# Patient Record
Sex: Female | Born: 1935
Health system: Southern US, Community
[De-identification: ages and names within clinical notes are randomized; demographics above are authoritative.]

## PROBLEM LIST (undated history)

## (undated) DIAGNOSIS — I739 Peripheral vascular disease, unspecified: Secondary | ICD-10-CM

## (undated) DIAGNOSIS — E785 Hyperlipidemia, unspecified: Secondary | ICD-10-CM

## (undated) DIAGNOSIS — K219 Gastro-esophageal reflux disease without esophagitis: Secondary | ICD-10-CM

## (undated) DIAGNOSIS — J449 Chronic obstructive pulmonary disease, unspecified: Secondary | ICD-10-CM

## (undated) DIAGNOSIS — I219 Acute myocardial infarction, unspecified: Secondary | ICD-10-CM

## (undated) DIAGNOSIS — I779 Disorder of arteries and arterioles, unspecified: Secondary | ICD-10-CM

## (undated) DIAGNOSIS — I2510 Atherosclerotic heart disease of native coronary artery without angina pectoris: Secondary | ICD-10-CM

## (undated) HISTORY — DX: Chronic obstructive pulmonary disease, unspecified: J44.9

## (undated) HISTORY — DX: Acute myocardial infarction, unspecified: I21.9

## (undated) HISTORY — DX: Peripheral vascular disease, unspecified: I73.9

## (undated) HISTORY — DX: Disorder of arteries and arterioles, unspecified: I77.9

## (undated) HISTORY — DX: Atherosclerotic heart disease of native coronary artery without angina pectoris: I25.10

## (undated) HISTORY — DX: Hyperlipidemia, unspecified: E78.5

## (undated) HISTORY — DX: Gastro-esophageal reflux disease without esophagitis: K21.9

---

## 2007-03-29 HISTORY — PX: CORONARY ARTERY BYPASS GRAFT: SHX141

## 2007-08-31 ENCOUNTER — Ambulatory Visit: Payer: Self-pay | Admitting: Cardiology

## 2007-09-01 ENCOUNTER — Encounter: Payer: Self-pay | Admitting: Cardiology

## 2007-09-01 ENCOUNTER — Encounter: Payer: Self-pay | Admitting: Physician Assistant

## 2007-09-02 ENCOUNTER — Ambulatory Visit: Payer: Self-pay | Admitting: Internal Medicine

## 2007-09-02 ENCOUNTER — Inpatient Hospital Stay (HOSPITAL_COMMUNITY): Admission: EM | Admit: 2007-09-02 | Discharge: 2007-09-13 | Payer: Self-pay | Admitting: Internal Medicine

## 2007-09-03 ENCOUNTER — Encounter: Payer: Self-pay | Admitting: Internal Medicine

## 2007-09-04 ENCOUNTER — Encounter: Payer: Self-pay | Admitting: Thoracic Surgery (Cardiothoracic Vascular Surgery)

## 2007-09-04 ENCOUNTER — Ambulatory Visit: Payer: Self-pay | Admitting: Thoracic Surgery (Cardiothoracic Vascular Surgery)

## 2007-09-13 ENCOUNTER — Inpatient Hospital Stay: Admission: AD | Admit: 2007-09-13 | Discharge: 2007-09-14 | Payer: Self-pay | Admitting: Internal Medicine

## 2007-09-27 ENCOUNTER — Ambulatory Visit: Payer: Self-pay | Admitting: Thoracic Surgery (Cardiothoracic Vascular Surgery)

## 2007-10-02 ENCOUNTER — Ambulatory Visit: Payer: Self-pay | Admitting: Cardiology

## 2007-10-02 ENCOUNTER — Encounter: Payer: Self-pay | Admitting: Physician Assistant

## 2007-10-05 ENCOUNTER — Ambulatory Visit: Payer: Self-pay | Admitting: Thoracic Surgery (Cardiothoracic Vascular Surgery)

## 2007-10-05 ENCOUNTER — Encounter
Admission: RE | Admit: 2007-10-05 | Discharge: 2007-10-05 | Payer: Self-pay | Admitting: Thoracic Surgery (Cardiothoracic Vascular Surgery)

## 2007-11-20 ENCOUNTER — Ambulatory Visit: Payer: Self-pay | Admitting: Cardiology

## 2008-02-11 ENCOUNTER — Encounter: Payer: Self-pay | Admitting: Cardiology

## 2008-04-10 ENCOUNTER — Ambulatory Visit: Payer: Self-pay | Admitting: Cardiology

## 2008-04-10 ENCOUNTER — Encounter: Payer: Self-pay | Admitting: Physician Assistant

## 2008-06-16 ENCOUNTER — Encounter: Payer: Self-pay | Admitting: Cardiology

## 2008-07-25 ENCOUNTER — Ambulatory Visit: Payer: Self-pay | Admitting: Cardiology

## 2008-07-25 ENCOUNTER — Encounter: Payer: Self-pay | Admitting: Physician Assistant

## 2008-08-05 ENCOUNTER — Encounter: Payer: Self-pay | Admitting: Physician Assistant

## 2008-10-15 ENCOUNTER — Encounter: Payer: Self-pay | Admitting: Cardiology

## 2008-10-16 DIAGNOSIS — E782 Mixed hyperlipidemia: Secondary | ICD-10-CM

## 2008-10-16 DIAGNOSIS — R0989 Other specified symptoms and signs involving the circulatory and respiratory systems: Secondary | ICD-10-CM | POA: Insufficient documentation

## 2008-12-22 ENCOUNTER — Encounter: Payer: Self-pay | Admitting: Physician Assistant

## 2009-01-12 ENCOUNTER — Encounter: Payer: Self-pay | Admitting: Cardiology

## 2009-01-18 ENCOUNTER — Encounter: Payer: Self-pay | Admitting: Cardiology

## 2009-01-26 ENCOUNTER — Encounter: Payer: Self-pay | Admitting: Cardiology

## 2009-02-05 ENCOUNTER — Ambulatory Visit: Payer: Self-pay | Admitting: Cardiology

## 2009-02-05 DIAGNOSIS — I6529 Occlusion and stenosis of unspecified carotid artery: Secondary | ICD-10-CM

## 2009-03-22 IMAGING — CR DG CHEST 2V
2 series · 2 of 2 positions shown · non-contrast
Comparison: Chest x-ray of 09/10/2007

CLINICAL DATA: Dizziness, status post CABG

CHEST - 2 VIEW

[w chest pa]
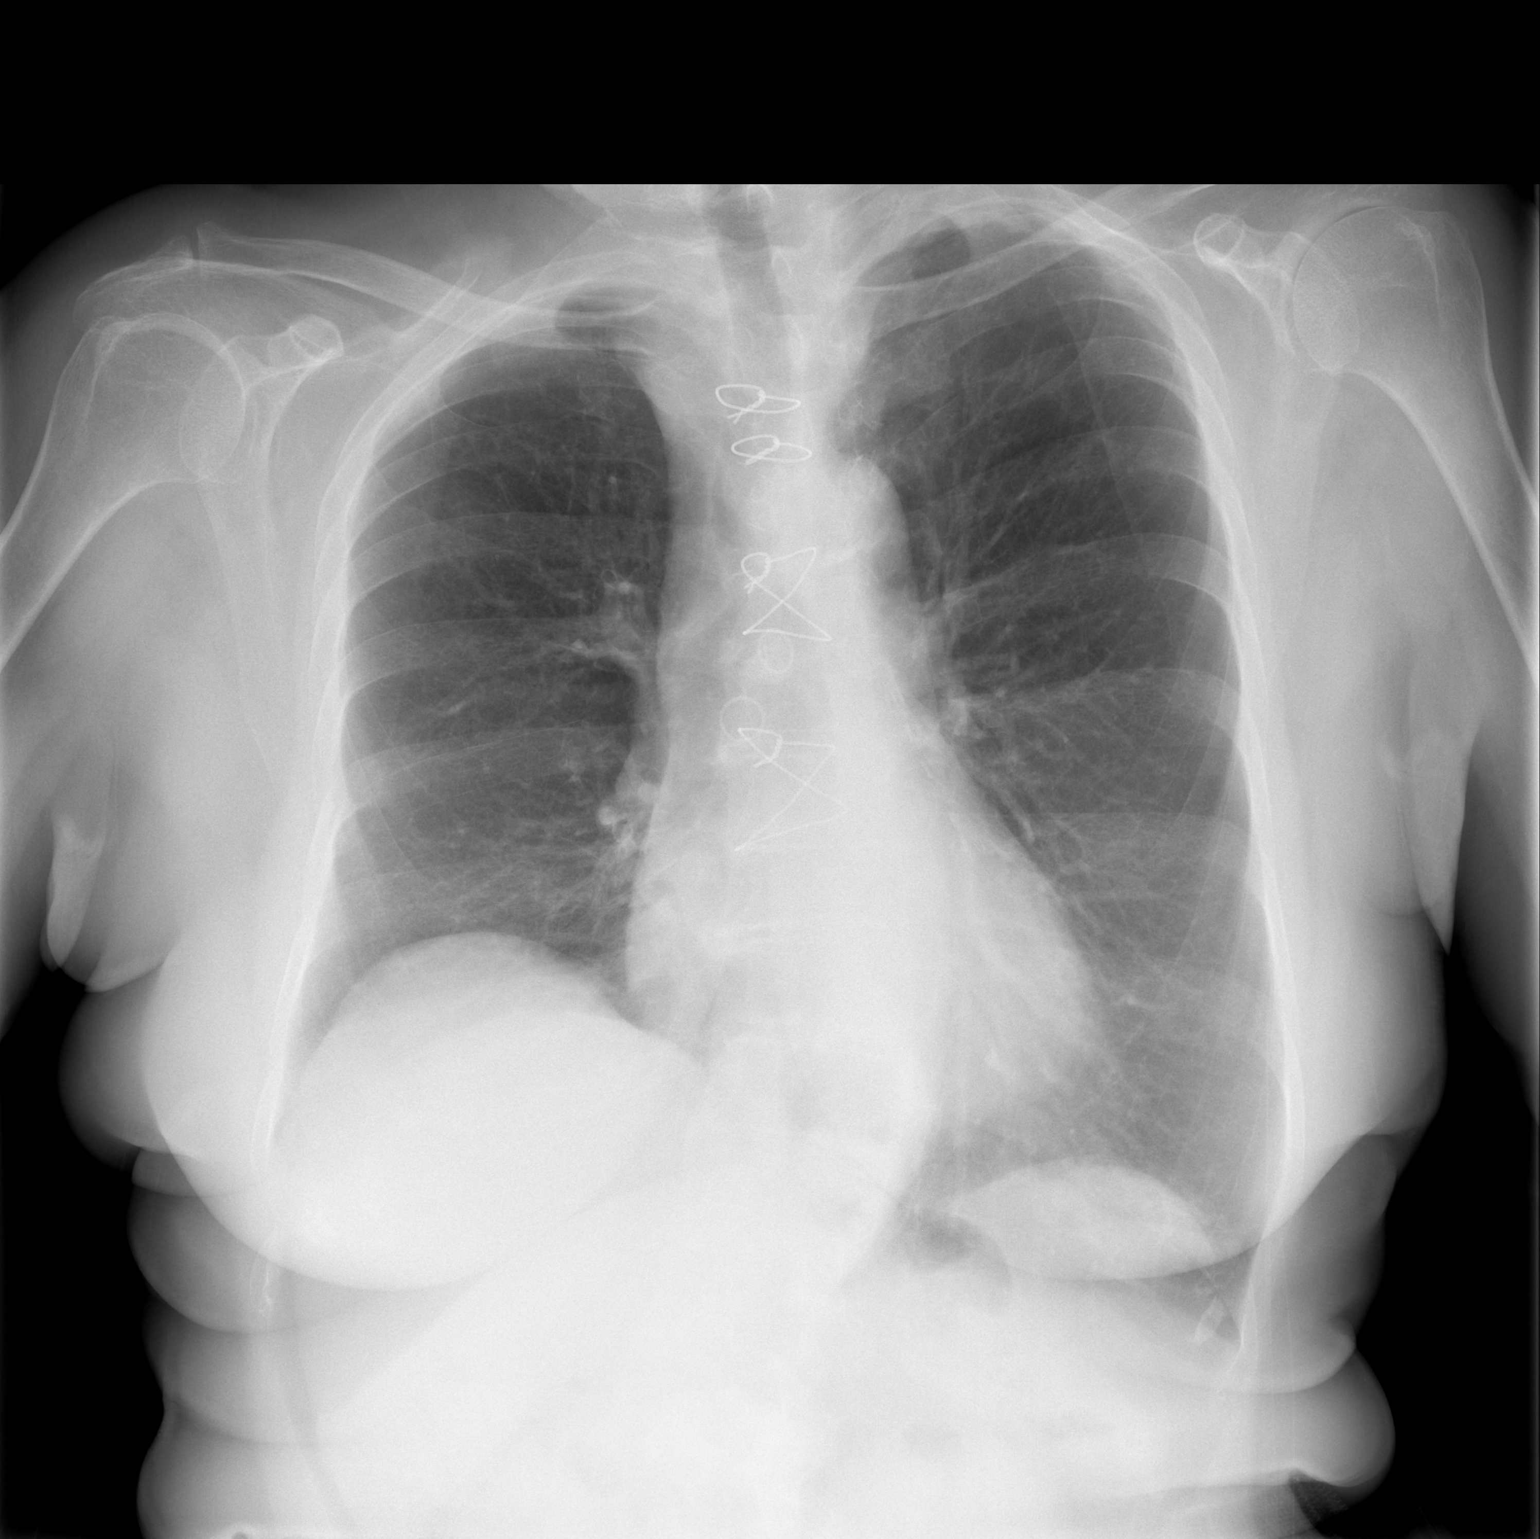

[w chest lat]
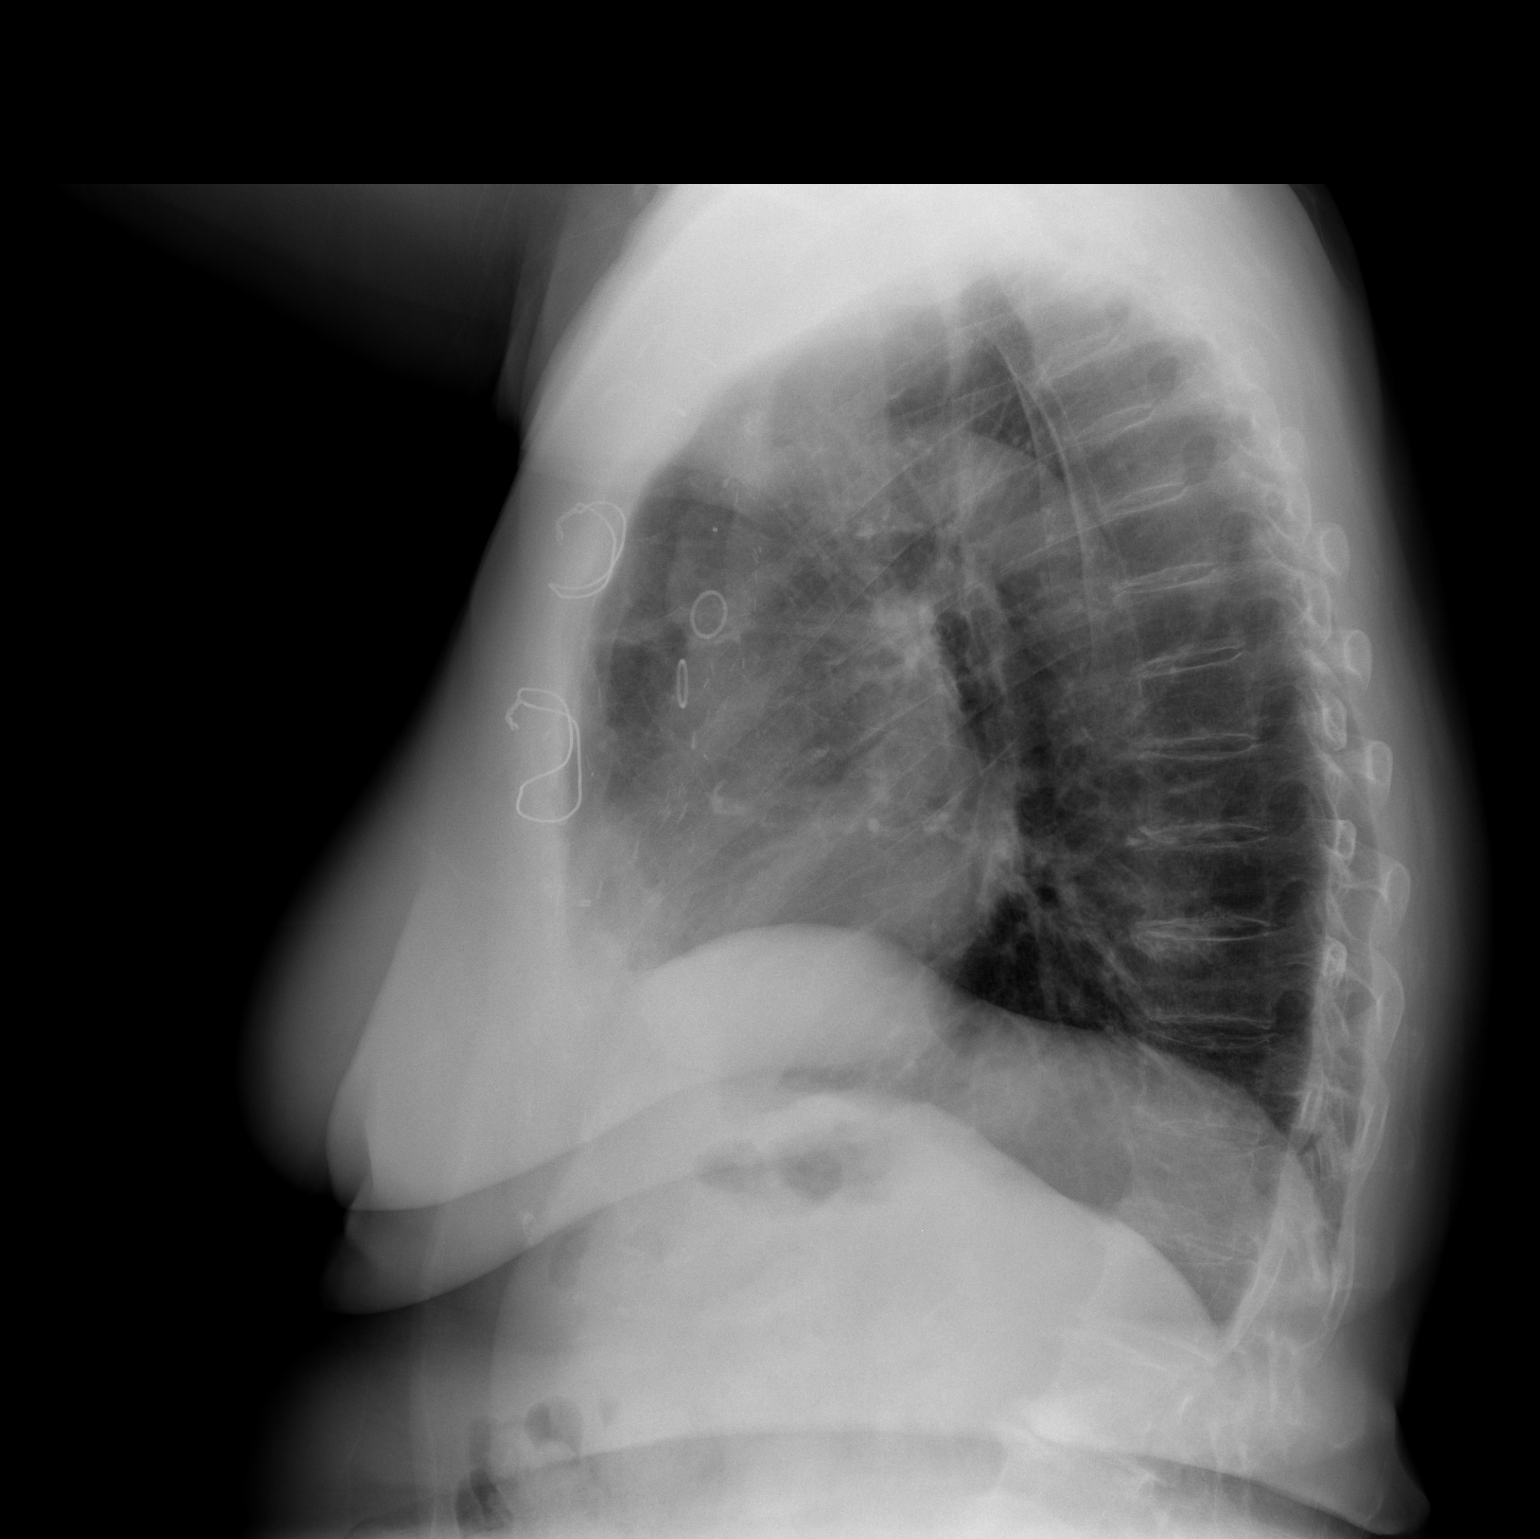

[2 of 2 positions shown; findings below may reference images not displayed]

FINDINGS: The effusions have resolved and aeration has improved.
The heart is within upper limits of normal.  There may be a small
hiatal hernia present.  Median sternotomy sutures are noted.  The
bones are osteopenic clear
IMPRESSION: No active lung disease.  Resolution of effusions and basilar
atelectasis.

## 2009-07-06 ENCOUNTER — Telehealth (INDEPENDENT_AMBULATORY_CARE_PROVIDER_SITE_OTHER): Payer: Self-pay | Admitting: *Deleted

## 2009-08-03 ENCOUNTER — Encounter (INDEPENDENT_AMBULATORY_CARE_PROVIDER_SITE_OTHER): Payer: Self-pay | Admitting: *Deleted

## 2009-08-04 ENCOUNTER — Encounter: Payer: Self-pay | Admitting: Cardiology

## 2009-08-11 ENCOUNTER — Ambulatory Visit: Payer: Self-pay | Admitting: Cardiology

## 2009-08-11 DIAGNOSIS — I2510 Atherosclerotic heart disease of native coronary artery without angina pectoris: Secondary | ICD-10-CM | POA: Insufficient documentation

## 2009-08-11 DIAGNOSIS — J449 Chronic obstructive pulmonary disease, unspecified: Secondary | ICD-10-CM

## 2009-08-11 DIAGNOSIS — J4489 Other specified chronic obstructive pulmonary disease: Secondary | ICD-10-CM | POA: Insufficient documentation

## 2009-08-26 ENCOUNTER — Encounter: Payer: Self-pay | Admitting: Cardiology

## 2009-09-08 ENCOUNTER — Telehealth (INDEPENDENT_AMBULATORY_CARE_PROVIDER_SITE_OTHER): Payer: Self-pay | Admitting: *Deleted

## 2009-10-12 ENCOUNTER — Encounter (INDEPENDENT_AMBULATORY_CARE_PROVIDER_SITE_OTHER): Payer: Self-pay | Admitting: *Deleted

## 2009-10-21 ENCOUNTER — Encounter (INDEPENDENT_AMBULATORY_CARE_PROVIDER_SITE_OTHER): Payer: Self-pay | Admitting: *Deleted

## 2009-10-26 ENCOUNTER — Encounter: Payer: Self-pay | Admitting: Cardiology

## 2010-01-04 ENCOUNTER — Encounter (INDEPENDENT_AMBULATORY_CARE_PROVIDER_SITE_OTHER): Payer: Self-pay | Admitting: *Deleted

## 2010-02-01 ENCOUNTER — Encounter: Payer: Self-pay | Admitting: Cardiology

## 2010-02-08 ENCOUNTER — Ambulatory Visit: Payer: Self-pay | Admitting: Cardiology

## 2010-03-11 ENCOUNTER — Telehealth: Payer: Self-pay | Admitting: Cardiology

## 2010-04-16 ENCOUNTER — Encounter (INDEPENDENT_AMBULATORY_CARE_PROVIDER_SITE_OTHER): Payer: Self-pay | Admitting: *Deleted

## 2010-04-27 NOTE — Miscellaneous (Signed)
Summary: Orders Update  Clinical Lists Changes  Orders: Added new Test order of T-Lipid Profile ([id number]) - Signed Added new Test order of T-Hepatic Function ([id number]) - Signed 

## 2010-04-27 NOTE — Letter (Signed)
Summary: Generic Engineer, agricultural at [facility] [address]   [place], [place] [address]   Phone: [phone]  Fax: [phone]        October 21, 2009 MRN: [id number]    [facility] [address] [place], [place]  [address]    Dear Ms. Cendejas,    You are due to have lab work done. An order was faxed to the [facility] for this lab work on October 12, 2009. However, it does not appear this has been done yet.  Please, take the enclosed order to the [facility] at your earliest convenience to have this done. If you will not be able to do your lab work at this time, please notify our office so that we can properly document this in  your chart.       Sincerely,  Cyril Loosen, RN, BSN  This letter has been electronically signed by your physician.

## 2010-04-27 NOTE — Progress Notes (Signed)
Summary: Needs Refill on NTG  Phone Note Call from Patient Call back at Home Phone [phone]   Summary of Call: Pt left message on voicemail stating she has had the same NTG bottle for the past 2 yrs. She was told this should be changed every 6 months. If this is true, [facility] She stated in the message that it is okay to leave the answer on her voicemail. She also stated that she has an appt on 5/17.   Pt notified and verbalized understanding. She also mentioned that she usually does labs before her appts. Per November 2010 OV note, pt will have FLP/LFT done at time of next office visit. Notified pt she can have this done before her appt. She is aware to be NPO.  Initial call taken by: Cyril Loosen, RN, BSN,  July 06, 2009 12:51 PM    New/Updated Medications: NITROSTAT 0.4 MG SUBL (NITROGLYCERIN) Place on tablet under tongue every 5 minutes up to 3 doses as needed for chest pain. No more than 3 doses over a 15 minute period. Prescriptions: NITROSTAT 0.4 MG SUBL (NITROGLYCERIN) Place on tablet under tongue every 5 minutes up to 3 doses as needed for chest pain. No more than 3 doses over a 15 minute period.  #25 x 3   Entered by:   Cyril Loosen, RN, BSN   Authorized by:   Loreli Slot, MD, [facility]   Signed by:   Cyril Loosen, RN, BSN on 07/06/2009   Method used:   [facility]  [address] [id number]       [address]       [place], [place]  [address]       Ph: [phone] or [phone]       Fax: [phone]   RxID:   [id number]

## 2010-04-27 NOTE — Miscellaneous (Signed)
Summary: Orders Update  Clinical Lists Changes  Orders: Added new Test order of T-Hepatic Function ([id number]) - Signed Added new Test order of T-Lipid Profile ([id number]) - Signed 

## 2010-04-27 NOTE — Assessment & Plan Note (Signed)
Summary: 6 mo fu per NOVE REMINDER   Visit Type:  Follow-up Primary Ewing Fandino:  Dr. Donzetta Sprung   History of Present Illness: 77 year old woman presents for followup. I saw her back in May. She reports no angina or limiting shortness of breath. She continues to dance regularly.  Followup labs from 7 November revealed an AST of 51, ALT 44, cholesterol 208, triglycerides 113, HDL 60, LDL 125. I reviewed these with her today. She states that she has not been tolerating even low-dose simvastatin 10 mg a day related to leg discomfort. She is actually not taking it at this point. She is on some other cholesterol-lowering supplements.  No claudication symptoms. Blood pressure control has been good.  Current Medications (verified): 1)  Metoprolol Tartrate 25 Mg Tabs (Metoprolol Tartrate) .... Take 1 Tablet By Mouth Two Times A Day 2)  Aspirin 81 Mg Tbec (Aspirin) .... Take 2 Tablet By Mouth Once A Day 3)  Fish Oil Triple Strength 1360 Mg Caps (Omega-3 Fatty Acids) .... Take 2 Tablet By Mouth Two Times A Day 4)  Zyrtec Allergy 10 Mg Tabs (Cetirizine Hcl) .... Take 1 Tablet By Mouth Once A Day As Needed 5)  Proventil Hfa 108 (90 Base) Mcg/act Aers (Albuterol Sulfate) .... As Needed 6)  Nitrostat 0.4 Mg Subl (Nitroglycerin) .... Place On Tablet Under Tongue Every 5 Minutes Up To 3 Doses As Needed For Chest Pain. No More Than 3 Doses Over A 15 Minute Period. 7)  Symbicort 80-4.5 Mcg/act Aero (Budesonide-Formoterol Fumarate) .... Twice Daily 8)  Complete  Tabs (Multiple Vitamins-Minerals) .... Take 1 Tablet By Mouth Once A Day 9)  Odorless Garlic 500 Mg Tabs (Garlic) .... Take 2 Tablet By Mouth Once A Day 10)  Simvastatin 10 Mg Tabs (Simvastatin) .... Take One Tablet By Mouth Daily At Bedtime 11)  Lisinopril 5 Mg Tabs (Lisinopril) .... Take 1 Tablet By Mouth Once A Day 12)  Cholest Wise .... Take 2 Tablet By Mouth Two Times A Day 13)  Super Calcium/d 600-125 Mg-Unit Tabs (Calcium-Vitamin D) .... Take 2  Tablet By Mouth Once A Day 14)  Stress B Complex/zinc  Tabs (Multiple Vitamins-Minerals) .... Take 1 Tablet By Mouth Once A Day  Allergies (verified): 1)  ! Pcn  Comments:  Nurse/Medical Assistant: The patient's medication list and allergies were reviewed with the patient and were updated in the Medication and Allergy Lists.  Past History:  Past Medical History: Last updated: 02/05/2009 COPD CAD Hyperlipidemia G E R D Myocardial Infarction - NSTEMI June 2009 Cerebrovascular Disease - less than 50% RICA, 50-69% LICA, May 2010  Past Surgical History: Last updated: 02/05/2009 CABG - off-pump LIMA to LAD, SVG to first obtuse marginal, SVG to PDA, June 2009  Social History: Last updated: 10/16/2008 Widowed  Tobacco Use - Former.  - 1999 Alcohol Use - no Regular Exercise - yes dancing 15 hrs a week Drug Use - no Retired   Review of Systems  The patient denies anorexia, fever, weight loss, chest pain, syncope, dyspnea on exertion, peripheral edema, melena, and hematochezia.         Otherwise reviewed and negative.  Vital Signs:  Patient profile:   77 year old female Height:      64 inches Weight:      166 pounds BMI:     28.60 O2 Sat:      94 % on Room air Pulse rate:   58 / minute BP sitting:   116 / 67  (left arm)  Cuff size:   regular  Vitals Entered By: Carlye Grippe (February 08, 2010 8:56 AM)  Nutrition Counseling: Patient's BMI is greater than 25 and therefore counseled on weight management options.  O2 Flow:  Room air  Physical Exam  Additional Exam:  Overweight woman in no acute distress. HEENT: Conjunctiva and lids normal, oropharynx with moist mucosa. Neck: Supple, no elevated JVP, soft left bruit.. Lungs: Diminished breath sounds, slight end expiratory rhonchi. No active wheezing. Cardiac: Regular rate and rhythm, no S3. Thorax: Chest wall stable status post sternotomy. Abdomen: Soft, nontender, bowel sounds present. Skin: Warm and  dry. Musculoskeletal: No gross deformities. Extremities: No pitting edema, distal pulses one plus. Neuropsychiatric: Alert and oriented x3, affect appropriate.   Carotid Doppler  Procedure date:  08/26/2009  Findings:      50-69% stenosis of the LICA and no evidence of hemodynamically significant stenosis of the RICA. Bilateral proximal ECA stenoses. Moderate calcified plaque in the carotid bulb and proximal ICAs.  EKG  Procedure date:  02/08/2010  Findings:      Sinus rhythm at 61 beats per minutes with septal Q waves, nonspecific ST changes.  Impression & Recommendations:  Problem # 1:  CORONARY ATHEROSCLEROSIS NATIVE CORONARY ARTERY (ICD-414.01)  Symptomatically stable on medical therapy. No changes made today. Encouraged regular exercise. I will see her back on an annual basis. Continue to follow up with primary care in the meanwhile.  Her updated medication list for this problem includes:    Metoprolol Tartrate 25 Mg Tabs (Metoprolol tartrate) .Marland Kitchen... Take 1 tablet by mouth two times a day    Aspirin 81 Mg Tbec (Aspirin) .Marland Kitchen... Take 2 tablet by mouth once a day    Nitrostat 0.4 Mg Subl (Nitroglycerin) .Marland Kitchen... Place on tablet under tongue every 5 minutes up to 3 doses as needed for chest pain. no more than 3 doses over a 15 minute period.    Lisinopril 5 Mg Tabs (Lisinopril) .Marland Kitchen... Take 1 tablet by mouth once a day  Problem # 2:  CAROTID ARTERY DISEASE (ICD-433.10)  Moderate stenosis on the left, followup Doppler in one year.  Her updated medication list for this problem includes:    Aspirin 81 Mg Tbec (Aspirin) .Marland Kitchen... Take 2 tablet by mouth once a day  Orders: EKG w/ Interpretation (93000)  Her updated medication list for this problem includes:    Aspirin 81 Mg Tbec (Aspirin) .Marland Kitchen... Take 2 tablet by mouth once a day  Problem # 3:  HYPERLIPIDEMIA-MIXED (ICD-272.4)  Plan to discontinue simvastatin altogether. She will continue strategy of diet and her other supplements. Plan  to followup fasting lipid profile and liver function tests in 3 months. We can also discuss a different statin medication down the road if she is willing.  The following medications were removed from the medication list:    Simvastatin 10 Mg Tabs (Simvastatin) .Marland Kitchen... Take one tablet by mouth daily at bedtime  Patient Instructions: 1)  Your physician wants you to follow-up in: 1 year. You will receive a reminder letter in the mail one-two months in advance. If you don't receive a letter, please call our office to schedule the follow-up appointment. 2)  Your physician has requested that you have a carotid duplex. This test is an ultrasound of the carotid arteries in your neck. It looks at blood flow through these arteries that supply the brain with blood. Allow one hour for this exam. There are no restrictions or special instructions. DO IN 1 YEAR BEFORE YOUR OFFICE VISIT.  3)  Your physician recommends that you go to the [facility] for a FASTING lipid profile and liver function labs:  DO IN 3 MONTHS. 4)  Stop Simvastatin.

## 2010-04-27 NOTE — Assessment & Plan Note (Signed)
Summary: 6 mo fu per may reminder   Visit Type:  Follow-up Primary Provider:  Dr. Selinda Flavin   History of Present Illness: 77 year old woman presents for followup. She states that she continues to dance regularly, 5 days a week. She has been experiencing more shortness of breath, and seems to indicate an increase in wheezing associated with her COPD. She also reports a dry cough. She has concerns about both her metoprolol and lisinopril. We discussed these issues today.  Followup labs from 10 May reveal AST 33,  ALT 33, total cholesterol 210, triglycerides 156, HDL 56, LDL 123. She states she was not able to tolerate low-dose Crestor reporting "muscle aches." She has not tried simvastatin.  She does not indicate any anginal chest pain. She otherwise reports compliance with her medications which are reviewed below. She has not required any nitroglycerin.  Current Medications (verified): 1)  Lisinopril 5 Mg Tabs (Lisinopril) .... Take 1 Tablet By Mouth Twice A Day 2)  Metoprolol Tartrate 25 Mg Tabs (Metoprolol Tartrate) .... Take 1 Tablet By Mouth Twice A Day 3)  Aspirin 81 Mg Tbec (Aspirin) .... Take 1 Tablet By Mouth Once A Day 4)  Fish Oil Double Strength 1200 Mg Caps (Omega-3 Fatty Acids) .... Take 1 Tablet By Mouth Two Times A Day 5)  Zyrtec Allergy 10 Mg Tabs (Cetirizine Hcl) .... Take 1 Tablet By Mouth Once A Day 6)  Proventil Hfa 108 (90 Base) Mcg/act Aers (Albuterol Sulfate) .... As Needed 7)  Nitrostat 0.4 Mg Subl (Nitroglycerin) .... Place On Tablet Under Tongue Every 5 Minutes Up To 3 Doses As Needed For Chest Pain. No More Than 3 Doses Over A 15 Minute Period. 8)  Symbicort 80-4.5 Mcg/act Aero (Budesonide-Formoterol Fumarate) .... Twice Daily 9)  Complete  Tabs (Multiple Vitamins-Minerals) .... Take 1 Tablet By Mouth Once A Day 10)  Betaine Hcl 300 Mg Tabs (Betaine Hcl) .... Use As Directed 11)  Odorless Garlic 500 Mg Tabs (Garlic) .... Take 2 Tablet By Mouth Once A Day 12)   Chelated Potassium 99 Mg Tabs (Potassium) .... Take 1 Tablet By Mouth Once A Day 13)  Time Release Circuleg .... Take 1 Tablet By Mouth Once A Day  Allergies (verified): 1)  ! Pcn  Comments:  Nurse/Medical Assistant: The patient's medications and allergies were reviewed with the patient and were updated in the Medication and Allergy Lists. List reviewed.  Past History:  Past Medical History: Last updated: 02/05/2009 COPD CAD Hyperlipidemia G E R D Myocardial Infarction - NSTEMI June 2009 Cerebrovascular Disease - less than 50% RICA, 50-69% LICA, May 2010  Past Surgical History: Last updated: 02/05/2009 CABG - off-pump LIMA to LAD, SVG to first obtuse marginal, SVG to PDA, June 2009  Social History: Last updated: 10/16/2008 Widowed  Tobacco Use - Former.  - 1999 Alcohol Use - no Regular Exercise - yes dancing 15 hrs a week Drug Use - no Retired   Review of Systems       The patient complains of dyspnea on exertion.  The patient denies anorexia, fever, weight loss, chest pain, peripheral edema, prolonged cough, headaches, hemoptysis, melena, hematochezia, and severe indigestion/heartburn.         Otherwise reviewed and negative except as outlined above.  Vital Signs:  Patient profile:   77 year old female Height:      64 inches Weight:      165 pounds O2 Sat:      98 % on Room air Pulse rate:  60 / minute BP sitting:   104 / 62  (left arm) Cuff size:   regular  Vitals Entered By: Carlye Grippe (Aug 11, 2009 8:23 AM)  O2 Flow:  Room air  Physical Exam  Additional Exam:  Overweight woman in no acute distress. HEENT: Conjunctiva and lids normal, oropharynx with moist mucosa. Neck: Supple, no elevated JVP, soft left bruit.. Lungs: Diminished breath sounds, slight end expiratory rhonchi. No active wheezing. Cardiac: Regular rate and rhythm, no S3. Thorax: Chest wall stable status post sternotomy. Abdomen: Soft, nontender, bowel sounds present. Skin: Warm and  dry. Musculoskeletal: No gross deformities. Extremities: No pitting edema, distal pulses one plus. Neuropsychiatric: Alert and oriented x3, affect appropriate.   EKG  Procedure date:  08/11/2009  Findings:      Sinus rhythm at 64 beats per minutes with PAC.  Impression & Recommendations:  Problem # 1:  CORONARY ATHEROSCLEROSIS NATIVE CORONARY ARTERY (ICD-414.01)  Patient relatively stable, with electrocardiogram being nonspecific today. She continues to exercise regularly in the form of dancing, 5 days a week. It may be that some of her increased shortness of breath is more pulmonary in etiology based on her description of mild wheezing and also a dry cough. We discussed modifying her medical regimen, including discontinuation of lisinopril in favor of Cozaar, and also reducing her metoprolol it in half. If these changes are not effective, I asked her to let us know, otherwise we will see her back over the next 6 months.  The following medications were removed from the medication list:    Lisinopril 5 Mg Tabs (Lisinopril) .Marland Kitchen... Take 1 tablet by mouth twice a day Her updated medication list for this problem includes:    Metoprolol Tartrate 25 Mg Tabs (Metoprolol tartrate) .Marland Kitchen... Take 1/2 tablet by mouth two times a day    Aspirin 81 Mg Tbec (Aspirin) .Marland Kitchen... Take 1 tablet by mouth once a day    Nitrostat 0.4 Mg Subl (Nitroglycerin) .Marland Kitchen... Place on tablet under tongue every 5 minutes up to 3 doses as needed for chest pain. no more than 3 doses over a 15 minute period.  Problem # 2:  COPD (ICD-496)  As noted above, will reduce beta blocker dose. She will otherwise continue her current pulmonary regimen under the direction of Dr. Dimas Aguas.  Her updated medication list for this problem includes:    Proventil Hfa 108 (90 Base) Mcg/act Aers (Albuterol sulfate) .Marland Kitchen... As needed    Symbicort 80-4.5 Mcg/act Aero (Budesonide-formoterol fumarate) .Marland Kitchen... Twice daily  Problem # 3:  HYPERLIPIDEMIA-MIXED  (ICD-272.4)  LDL not optimally controlled. She did not tolerate low-dose Crestor as noted. Will try simvastatin 10 mg p.o. q.h.s., and if tolerated, followup with lipid profile and liver function tests in 12 weeks.  The following medications were removed from the medication list:    Crestor 10 Mg Tabs (Rosuvastatin calcium) .Marland Kitchen... Take 1/2  tablet by mouth at bedtime    Niacin Cr 500 Mg Cr-caps (Niacin) .Marland Kitchen... Take 3 tablet by mouth once a day Her updated medication list for this problem includes:    Simvastatin 10 Mg Tabs (Simvastatin) .Marland Kitchen... Take one tablet by mouth daily at bedtime  Problem # 4:  CAROTID ARTERY DISEASE (ICD-433.10)  Nonobstructive based on Dopplers from May 2010. Followup study will be arranged.  Her updated medication list for this problem includes:    Aspirin 81 Mg Tbec (Aspirin) .Marland Kitchen... Take 1 tablet by mouth once a day  Orders: Carotid Duplex (Carotid Duplex)  Other Orders:  EKG w/ Interpretation (93000)  Patient Instructions: 1)  Your physician wants you to follow-up in: 6 months. You will receive a reminder letter in the mail one-two months in advance. If you don't receive a letter, please call our office to schedule the follow-up appointment. 2)  Your physician recommends that you go to the [facility] for a FASTING lipid profile and liver function labs:  IN 12 WEEKS. You will get a letter when it is time for this lab work. 3)  Start Simvastatin 10mg  by mouth at bedtime. 4)  Decrease Lopressor (metoprolol) to 1/2 tablet by mouth two times a day. 5)  Start Cozaar (losartan) 50mg  by mouth once daily. 6)  Stop Lisnipril. Prescriptions: LOSARTAN POTASSIUM 50 MG TABS (LOSARTAN POTASSIUM) Take 1 tablet by mouth once a day  #30 x 6   Entered by:   Cyril Loosen, RN, BSN   Authorized by:   Loreli Slot, MD, [facility]   Signed by:   Cyril Loosen, RN, BSN on 08/11/2009   Method used:   [facility]  [address] [address] [facility]       [address]       [place], [place]  [address]       Ph: [phone] or [phone]       Fax: [phone]   RxID:   [id number]   Handout requested. SIMVASTATIN 10 MG TABS (SIMVASTATIN) Take one tablet by mouth daily at bedtime  #30 x 6   Entered by:   Cyril Loosen, RN, BSN   Authorized by:   Loreli Slot, MD, [facility]   Signed by:   Cyril Loosen, RN, BSN on 08/11/2009   Method used:   [facility]  [address] [facility]       [address]       [place], [place]  [address]       Ph: [phone] or [phone]       Fax: [phone]   RxID:   [id number]   Handout requested.   Appended Document: 6 mo fu per may reminder Pt states she had Carotid Doppler done on 01/18/09 when she was in the ER at [facility] for SOB. Notified pt there is no record of Carotid doppler being done at that time. There is no report in [facility]. It is not mentioned in the ER note. It is not listed on the ER orders. Pt looked at bill from ER and read each charge. She was not charged for Carotid doppler. Notified pt it does not appear she has had doppler done in 1 year and Dr. Diona Browner would like this repeated at this time. Pt agreeable to have carotid doppelr done. She is aware Lynden Ang will schedule this test and notify her of appt time.

## 2010-04-27 NOTE — Progress Notes (Signed)
Summary: pt is back on old meds  Phone Note Call from Patient Call back at Home Phone [phone]   Caller: PT LEFT MESSAGE Reason for Call: Talk to Nurse Summary of Call: S: Pt was given new meds for BP and it was making her BP higher and causing headaces,  so she started back on the old meds she would rather have trouble breathing then no feel good. Initial call taken by: Faythe Ghee,  September 08, 2009 8:42 AM  Follow-up for Phone Call        Pt seen on 5/17. Lisinopril d/c'd, started Cozaar 50mg  once daily, decreased metoprolol to 25mg  1/2 two times a day. Pt states her BP running higher now than before. She states Sunday her BP was 200/100+ while reading. She states this am BP was 191/99 before taking meds. She went back to previous dosing of Lisinopril and metoprolol and stopped Cozaar. She states BP is 117/73 now. She states she has been having back pain and wonders if this is contributing to increased BP but feels previous med regimen better controls BP. Pt is aware note will be sent to Dr. Diona Browner for his review and she will be notified if he has any futher recommendations. Follow-up by: Cyril Loosen, RN, BSN,  September 08, 2009 11:08 AM  Additional Follow-up for Phone Call Additional follow up Details #1::        OK.  She should keep an eye on BP and breathing status. Additional Follow-up by: Loreli Slot, MD, [facility],  September 08, 2009 8:29 PM    Additional Follow-up for Phone Call Additional follow up Details #2::    Left message to notify pt.  Follow-up by: Cyril Loosen, RN, BSN,  September 09, 2009 10:15 AM

## 2010-04-27 NOTE — Letter (Signed)
Summary: Generic Engineer, agricultural at [facility] [address]   [place], [place] [address]   Phone: [phone]  Fax: [phone]        Aug 03, 2009 MRN: [id number]    [facility] [address] [place], [place]  [address]    Dear Ms. Fleer,   This is a reminder to have lab work done to check your cholesterol and liver function before your office visit with Dr. Diona Browner on May 17th. Please, take the enclosed order to the [facility] to have this lab work drawn. Do not eat or drink after midnight.   We will discuss the results of your labs at your office visit on May 17th at 8:20 am.        Sincerely,  Cyril Loosen, RN, BSN  This letter has been electronically signed by your physician.

## 2010-04-29 NOTE — Miscellaneous (Signed)
Summary: Orders Update  Clinical Lists Changes  Orders: Added new Test order of T-Hepatic Function ([id number]) - Signed Added new Test order of T-Lipid Profile ([id number]) - Signed 

## 2010-04-29 NOTE — Progress Notes (Signed)
Summary: Increased BP  Phone Note Call from Patient   Summary of Call: Pt called concerned about BP. She states last night her BP shot up. It was 193/96. She had a headache in the front portion of her head. She states she knew the NTG would take her BP down so she took NTG x3 over 15 mins and got BP down to an acceptable level. She states her BP this am is 138/66.   She does states that she has been having some cramps in her legs so she started taking OTC Potassium for this. She states normally her BP runs around 117/69.   She will monitor BP and notify our office if it seems to be trending higher than usual. Pt verbalized understanding.  Initial call taken by: Cyril Loosen, RN, BSN,  March 11, 2010 9:27 AM  Follow-up for Phone Call        Noted. Follow-up by: Loreli Slot, MD, [facility],  March 11, 2010 10:08 AM

## 2010-05-04 ENCOUNTER — Encounter: Payer: Self-pay | Admitting: Cardiology

## 2010-05-27 ENCOUNTER — Encounter: Payer: Self-pay | Admitting: Cardiology

## 2010-05-28 ENCOUNTER — Encounter: Payer: Self-pay | Admitting: Cardiology

## 2010-05-28 DIAGNOSIS — R0602 Shortness of breath: Secondary | ICD-10-CM | POA: Insufficient documentation

## 2010-05-31 ENCOUNTER — Encounter: Payer: Self-pay | Admitting: Physician Assistant

## 2010-06-02 ENCOUNTER — Encounter: Payer: Self-pay | Admitting: Physician Assistant

## 2010-06-02 DIAGNOSIS — R0602 Shortness of breath: Secondary | ICD-10-CM

## 2010-06-08 ENCOUNTER — Encounter: Payer: Self-pay | Admitting: *Deleted

## 2010-06-08 NOTE — Letter (Signed)
Summary: External Correspondence/  [employer]  External Correspondence/  [employer]   Imported By: Dorise Hiss 06/01/2010 16:39:39  _____________________________________________________________________  External Attachment:    Type:   Image     Comment:   External Document

## 2010-06-10 ENCOUNTER — Encounter: Payer: Self-pay | Admitting: *Deleted

## 2010-06-15 NOTE — Letter (Signed)
Summary: Engineer, materials at [facility]  [address]   [place], [place] [address]   Phone: [phone]  Fax: [phone]        June 10, 2010 MRN: [id number]    [facility] [address] [place], [place]  [address]    Dear Ms. Halabi,  Your test ordered by Selena Batten has been reviewed by your physician (or physician assistant) and was found to be normal or stable. Your physician (or physician assistant) felt no changes were needed at this time.  __X__ Echocardiogram  ____ Cardiac Stress Test  ____ Lab Work  ____ Peripheral vascular study of arms, legs or neck  ____ CT scan or X-ray  ____ Lung or Breathing test  ____ Other:   Thank you.   Cyril Loosen, RN, BSN    Duane Boston, M.D., F.A.C.C. Thressa Sheller, M.D., F.A.C.C. Oneal Grout, M.D., F.A.C.C. Cheree Ditto, M.D., F.A.C.C. Daiva Nakayama, M.D., F.A.C.C. Kenney Houseman, M.D., F.A.C.C. Jeanne Ivan, PA-C

## 2010-07-06 ENCOUNTER — Encounter: Payer: Self-pay | Admitting: Cardiology

## 2010-07-06 ENCOUNTER — Ambulatory Visit (INDEPENDENT_AMBULATORY_CARE_PROVIDER_SITE_OTHER): Payer: Medicare Other | Admitting: Cardiology

## 2010-07-06 VITALS — BP 126/58 | HR 59 | Ht 65.0 in | Wt 165.0 lb

## 2010-07-06 DIAGNOSIS — I2510 Atherosclerotic heart disease of native coronary artery without angina pectoris: Secondary | ICD-10-CM

## 2010-07-06 DIAGNOSIS — E782 Mixed hyperlipidemia: Secondary | ICD-10-CM

## 2010-07-06 DIAGNOSIS — I6529 Occlusion and stenosis of unspecified carotid artery: Secondary | ICD-10-CM

## 2010-07-06 DIAGNOSIS — R0602 Shortness of breath: Secondary | ICD-10-CM

## 2010-07-06 NOTE — Progress Notes (Signed)
Clinical Summary Hannah Estes is a 77 y.o.female presenting for followup. She was seen in November 2011.  Records indicate followup with [facility] back in early March with shortness of breath. PVCs were noted on ECG. Imdur was initiated. Lab work from 5 March showed BUN 15, creatinine 0.7, sodium 134, potassium 4.4, TSH 6.3.  Echocardiogram from 7 March showed mild LVH with LVEF 65%, diastolic dysfunction, mild to moderate left atrial enlargement, mild mitral regurgitation, thickened aortic valve with no stenosis, mild tricuspid regurgitation, RVSP 32 mm of mercury, no pericardial effusion.  She has had difficulty tolerating even low-dose statin therapy, and simvastatin was discontinued at her last visit. Followup lab work from 6 February showed AST 43, ALT 44, cholesterol 205, and was rides 146, HDL 55, LDL 121.  She states that since her visit with [facility] in early March, shortness of breath has improved significantly. She noted this initially when she was dancing, much less problematic at this time. No exertional chest pain. She does not indicate that Imdur was helpful with symptom control. Complains of a headache with that medication.  Today we discussed continued observation on medical therapy versus proceeding with a followup ischemic evaluation. She prefers observation at this point. No complaints of palpitations or syncope.   Allergies  Allergen Reactions  . Penicillins     Current outpatient prescriptions:albuterol (PROVENTIL HFA) 108 (90 BASE) MCG/ACT inhaler, Inhale 2 puffs into the lungs every 6 (six) hours as needed.  , Disp: , Rfl: ;  aspirin 81 MG tablet, Take 81 mg by mouth daily.  , Disp: , Rfl: ;  budesonide-formoterol (SYMBICORT) 80-4.5 MCG/ACT inhaler, Inhale 2 puffs into the lungs 2 (two) times daily.  , Disp: , Rfl:  calcium carbonate (OS-CAL) 600 MG TABS, Take 600 mg by mouth 2 (two) times daily with a meal.  , Disp: , Rfl: ;  cetirizine (ZYRTEC) 10 MG tablet, Take 10 mg  by mouth daily.  , Disp: , Rfl: ;  Garlic 500 MG CAPS, Take 1 capsule by mouth 2 (two) times daily.  , Disp: , Rfl: ;  levothyroxine (SYNTHROID, LEVOTHROID) 25 MCG tablet, Take 25 mcg by mouth daily.  , Disp: , Rfl:  lisinopril (PRINIVIL,ZESTRIL) 5 MG tablet, Take 5 mg by mouth daily.  , Disp: , Rfl: ;  metoprolol tartrate (LOPRESSOR) 25 MG tablet, Take 25 mg by mouth 2 (two) times daily.  , Disp: , Rfl: ;  Multiple Vitamins-Minerals (MULTIVITAMIN WITH MINERALS) tablet, Take 1 tablet by mouth daily.  , Disp: , Rfl: ;  Multiple Vitamins-Minerals (STRESS B COMPLEX/ZINC PO), Take 1 tablet by mouth daily.  , Disp: , Rfl:  nitroGLYCERIN (NITROSTAT) 0.4 MG SL tablet, Place 0.4 mg under the tongue every 5 (five) minutes as needed.  , Disp: , Rfl: ;  Omega-3 Fatty Acids (FISH OIL TRIPLE STRENGTH) 1360 MG CAPS, Take 1 capsule by mouth daily.  , Disp: , Rfl: ;  DISCONTD: isosorbide mononitrate (IMDUR) 30 MG 24 hr tablet, Take 30 mg by mouth daily.  , Disp: , Rfl:   Past Medical History  Diagnosis Date  . COPD (chronic obstructive pulmonary disease)   . Coronary artery disease     Multivessel, LVEF 65%  . Hyperlipidemia   . GERD (gastroesophageal reflux disease)   . Myocardial infarction     NSTEMI 6/09  . Carotid artery disease     Social History Hannah Estes reports that she has quit smoking. Her smoking use included Cigarettes. She has never used smokeless  tobacco. Hannah Estes reports that she does not drink alcohol.  Review of Systems Some trouble with breathing during the pollen season, additionally outlined above, otherwise reviewed and negative.  Physical Examination Filed Vitals:   07/06/10 0844  BP: 126/58  Pulse: 59  Overweight woman in no acute distress. HEENT: Conjunctiva and lids normal, oropharynx with moist mucosa. Neck: Supple, no elevated JVP, soft left bruit.. Lungs: Diminished breath sounds, slight end expiratory rhonchi. No active wheezing. Cardiac: Regular rate and rhythm,  no S3. Thorax: Chest wall stable status post sternotomy. Abdomen: Soft, nontender, bowel sounds present. Skin: Warm and dry. Musculoskeletal: No gross deformities. Extremities: No pitting edema, distal pulses one plus. Neuropsychiatric: Alert and oriented x3, affect appropriate.   ECG Sinus rhythm at 65 beats per minute with single PAC, nonspecific T-wave changes, diminished R wave progression anteriorly.  Studies Last carotid Doppler on 08/26/2009 showed 50-69% stenosis of the LICA and no evidence of hemodynamically significant stenosis of the RICA. Bilateral proximal ECA stenoses. Moderate calcified plaque in the carotid bulb and proximal ICAs.  Problem List and Plan

## 2010-07-06 NOTE — Assessment & Plan Note (Signed)
No active angina, ECG overall nonspecific with poor R wave progression. Known CAD status post CABG in 2009. We did discuss considering followup stress testing, however at this point she prefers observation since symptoms have improved. Continue office followup.

## 2010-07-06 NOTE — Assessment & Plan Note (Signed)
Improved overall. Could have been multifactorial with history of chronic lung disease, also during the pollen season. Followup cardiac testing is noted above, overall reassuring. If symptoms progress, we can always consider followup ischemic evaluation.

## 2010-07-06 NOTE — Assessment & Plan Note (Signed)
Due for followup carotid Doppler, this will be arranged.

## 2010-07-06 NOTE — Patient Instructions (Signed)
   Stop Imdur  Carotid Doppler just before next office visit.  Your physician wants you to follow up in:  3 months.  You will receive a reminder letter in the mail one-two months in advance.  If you don't receive a letter, please call our office to schedule the follow up appointment.

## 2010-07-06 NOTE — Assessment & Plan Note (Signed)
She indicates that she does not want to consider other statin therapies at this point.

## 2010-07-07 ENCOUNTER — Encounter: Payer: Self-pay | Admitting: Physician Assistant

## 2010-07-09 ENCOUNTER — Encounter: Payer: Self-pay | Admitting: Cardiology

## 2010-08-10 NOTE — Assessment & Plan Note (Signed)
[facility]                          Hannah Estes OFFICE NOTE   Hannah Estes, Hannah Estes                        MRN:          [id number]  DATE:10/02/2007                            DOB:          [birth year]    CARDIOLOGIST:  She will be new to Dr. Nona Dell.   PRIMARY CARE PHYSICIAN:  Dr. Selinda Flavin.   REASON FOR VISIT:  Post-hospitalization followup.   HISTORY OF PRESENT ILLNESS:  Hannah Estes is a 77 year old female  patient who was previously healthy until she presented to [facility] with chest pain.  She ruled in for non-ST-elevation myocardial  infarction and was transferred to [facility] for further  evaluation.  Cardiac catheterization was performed that revealed 3-  vessel disease and she was referred for bypass surgery.  This was done  by Dr. Dorris Fetch on September 07, 2007.  Her grafts included a LIMA to the  LAD, vein graft to the first obtuse marginal, and vein graft to the  posterior descending.  Her LV function was well preserved.  Her EF was  noted be at 55% by echocardiogram.  She had periapical hypokinesis.  Postoperatively, she did develop atrial fibrillation.  She was treated  with amiodarone.  She was supposed to be on this medication at  discharge.  Coumadin was not initiated.  She has seen Dr. Tyrone Sage back  in followup on September 27, 2007.  She apparently has some cellulitis  involving one of her chest tube sites.  She has been treated with  antibiotics.  She returns to our office for post-hospitalization  followup.   The patient denies any significant chest discomfort.  She notes chest  soreness, which is improving.  She denies any significant shortness of  breath.  She denies orthopnea, PND, or pedal edema.  She denies any  syncope, near syncope, or palpitations.   CURRENT MEDICATIONS:  1. Crestor - she recently stopped this secondary to myalgias.  2. Metoprolol 25 mg half a tablet b.i.d.  3. Singular 10  mg daily.  4. Aspirin 81 mg daily.  5. Bactrim b.i.d.  6. Nitroglycerin p.r.n. chest pain.  7. Tylenol p.r.n.  8. Protonix p.r.n.   PHYSICAL EXAMINATION:  GENERAL:  She is a well-nourished and well-  developed female, in no distress.  VITAL SIGNS:  Blood pressure 120/73, pulse 89, and weight 153 pounds.  HEENT:  Normal.  NECK:  Without JVD.  CARDIAC:  S1 and S2.  Regular rate and rhythm.  LUNGS:  Clear to auscultation bilaterally.  ABDOMEN:  Soft and nontender.  EXTREMITIES:  Without edema.  NEUROLOGIC:  She is alert and oriented x3.  Cranial nerves II through  XII are grossly intact.  VASCULAR:  Right femoral arteriotomy site without hematoma or bruit.   Electrocardiogram reveals sinus rhythm with a heart rate of 86, normal  axis, T-wave inversion in 1, aVL, V1 through 4.   IMPRESSION:  1. Coronary artery disease.      a.     Status post non-ST-elevation myocardial infarction in June       2009.  b.     Status post coronary artery bypass grafting on September 07, 2007, by Dr. Dorris Fetch as outlined above.  2. Preserved left ventricular function with an ejection fraction of      55%.  3. Treated dyslipidemia.      a.     Lipid panel on September 03, 2007:  Total cholesterol 210,       triglycerides 132, HDL 69, and LDL 115.  4. Gastroesophageal reflux disease.  5. Chronic obstructive pulmonary disease.  6. Postoperative anemia.  7. Postoperative atrial fibrillation.      a.     Maintaining normal sinus rhythm.      b.     Previously on amiodarone therapy.   PLAN:  1. Hannah Estes presents for post-hospitalization followup.  She is      doing remarkably well.  She sees Dr. Dorris Fetch for routine      followup later this week.  She is already walking several minutes a      day without difficulty.  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  2. She had to stop her Crestor secondary to myalgias.  I have       recommended pravastatin 20 mg a day.  If she can tolerate this, we      will check lipids and LFTs in 12 weeks.  3. We will have her follow up with a BMET and CBC today.  4. She was supposed to be on amiodarone.  However, this medication was      never prescribed.  She is maintaining normal sinus rhythm.  There      is really no indication to place her back on amiodarone at this      time.  I did want her to go on metoprolol 25 mg twice a day,      however, she noted significant hypotension on this dose previously.      We will continue her medications the same at this point.  5. The patient will be brought back in followup in the next 6 weeks      with Dr. Diona Browner or sooner p.r.n.      Tereso Newcomer, PA-C  Electronically Signed      Jonelle Sidle, MD  Electronically Signed   SW/MedQ  DD: 10/02/2007  DT: 10/03/2007  Job #: [id number]   cc:   Selinda Flavin

## 2010-08-10 NOTE — Assessment & Plan Note (Signed)
[facility]                          [facility] OFFICE NOTE   Hannah Estes, Hannah Estes                        MRN:          [id number]  DATE:07/25/2008                            DOB:          24-Jun-1925    PRIMARY CARDIOLOGIST:  Jonelle Sidle, MD   REASON FOR VISIT:  Six-month followup.   Hannah Estes is a delightful 77 year old female, status post three-  vessel CABG in June 2009, with normal left ventricular function, who  returns for scheduled followup.   Since her last visit here in August of last year, she continues to  remain extremely active, currently dancing as often as 3 times a week.  She denies any exertional angina pectoris.  She also denies any symptoms  suggestive of significant exertional dose dyspnea or easy fatiguability,  which were her presenting symptoms back in 2009.  Of note, she did have  postop paroxysmal atrial fibrillation, which was briefly treated with  amiodarone.  She denies any tachy palpitations.   Hannah Estes does have history of intolerance to numerous STATINS.  At  time of last visit, she was started on Pravachol, by Dr. Diona Browner, but  she then stopped this secondary to significant myalgia.  I suggested  increasing her niacin to 1000 mg daily, which she is currently  tolerating.  She is also on fish oil.  Her most recent profile in March  of this year notable for a total cholesterol of 225, triglyceride 188,  HDL 46, and LDL 141.  Of note, the LDL was identical to the previous  level in November of last year.   CURRENT MEDICATIONS:  1. Aspirin 81 daily.  2. Metoprolol 25 b.i.d.  3. Lisinopril 5 daily.  4. Fish oil 1000 t.i.d.  5. Niacin 1000 nightly.  6. Singulair 10 p.r.n.   PHYSICAL EXAMINATION:  VITAL SIGNS:  Blood pressure 118/62, pulse 63,  regular, weight 150.8 (up 3).  GENERAL:  An 77 year old female sitting upright, in no distress.  HEENT:  Normocephalic and atraumatic.  NECK:  Palpable  bilateral carotid pulses with soft, bilateral bruits  (right greater than left).  LUNGS:  Clear to auscultation in all fields.  HEART:  Regular rate and rhythm.  No significant murmurs.  No rubs.  ABDOMEN:  Soft and nontender.  EXTREMITIES:  No edema.  NEUROLOGIC:  No focal deficit.   IMPRESSION:  1. Multivessel coronary artery disease.      a.     Three-vessel CABG, June 2009.      b.     Postop atrial fibrillation.  2. Normal LVF.  3. Dyslipidemia.      a.     Intolerant to multiple statins.  4. Chronic obstructive pulmonary disease.      a.     Remote tobacco.  5. Bilateral carotid bruits.      a.     History of nonobstructive disease.   PLAN:  1. The patient has agreed to try Crestor again, but at the very low      dose of 5 mg daily.  I recommended this given her  persistently      elevated LDL level, despite being on fish oil and niacin.  In the      meanwhile, however, we will further increase fish oil to 2 tablets      twice daily, representing a total of 4 g.  We will continue the      current dose of niacin for the time being.  She will need followup      fasting lipid/liver profile in 12 weeks.  2. Schedule carotid Dopplers for reevaluation of noted bilateral      bruits.  Her preop studies in June 2009 did not show any      significant disease.  3. Schedule return clinic followup with myself and Dr. Diona Browner in 6      months.      Rozell Searing, PA-C  Electronically Signed      Jonelle Sidle, MD  Electronically Signed   GS/MedQ  DD: 07/25/2008  DT: 07/26/2008  Job #: [id number]   cc:   Selinda Flavin

## 2010-08-10 NOTE — Assessment & Plan Note (Signed)
OFFICE VISIT   Hannah Estes, Hannah Estes  DOB:  19-Jan-1926                                        September 27, 2007  CHART #:  [id number]   HISTORY:  The patient has undergone coronary artery bypass grafting on  09/07/2007, by Dr. Dorris Fetch.  The patient was discharged home to [facility] in [place].  However, once she got there she was almost  immediately discharged home.  She has been doing relatively well at  home, but returns today with the chest tube site in the midline being  red, swollen, and some drainage and another small area of redness to the  left.  She called, saying there was a wire sticking out of this area.   PHYSICAL EXAMINATION:  The patient comes into the office today.  VITAL SIGNS:  Her temperature is 97.5, blood pressure 156/86,  respiratory rate 18, pulse is 86, and O2 sats 94%.  GENERAL:  Overall, the patient looks well.  CHEST:  Her sternum is stable and well healed in the midline.  The old  chest tube site is with drainage and slight redness.  The pacing wires  were completely removed, and there was no wire sticking out of her.  There was one superficial skin pustule on the upper left abdomen not  associated with the pacing wire sites.  With this small site and the  chest tube erythema, it was felt that antibiotics would be appropriate.  Cultures were obtained on the chest tube site, and the patient was  started on Keflex 250 mg p.o. every 8 hours with the culture pending.  We will check sensitivities and change antibiotics as appropriately.   She has an appointment to see Dr. Dorris Fetch in approximately 1 week,  already made and we will keep that appointment.  She is instructed to  return to the office if there is no improvement, and to call our office  if there is no improvement over several days.   Sheliah Plane, MD  Electronically Signed   EG/MEDQ  D:  09/27/2007  T:  09/28/2007  Job:  [id number]   cc:   Salvatore Decent. Dorris Fetch,  M.D.

## 2010-08-10 NOTE — Op Note (Signed)
NAMESADEY, YANDELL               ACCOUNT NO.:  [id number]   MEDICAL RECORD NO.:  [id number]          PATIENT TYPE:  INP   LOCATION:  2311                         FACILITY:  [facility]   PHYSICIAN:  Salvatore Decent. Dorris Fetch, M.D.DATE OF BIRTH:  06-09-1925   DATE OF PROCEDURE:  09/07/2007  DATE OF DISCHARGE:                               OPERATIVE REPORT   PREOPERATIVE DIAGNOSIS:  Severe three-vessel coronary artery disease  with unstable angina.   POSTOPERATIVE DIAGNOSIS:  Severe three-vessel coronary artery disease  with unstable angina.   PROCEDURES:  Median sternotomy, off-pump coronary artery bypass grafting  x3 (left internal mammary artery to left anterior descending, saphenous  vein graft to first obtuse marginal, saphenous vein graft to posterior  descending), endoscopic vein harvest, right leg.   SURGEON:  Salvatore Decent. Dorris Fetch, MD   FIRST ASSISTANTSalvatore Decent. Cornelius Moras, MD   SECOND ASSISTANT:  Stephanie Acre. Dominick, PA   ANESTHESIA:  General.   FINDINGS:  Good-quality target vessels, mammary good-quality vein, fair-  quality, but acceptable for the patient's age, OM2 too small to graft.   CLINICAL NOTE:  Ms. Pinkstaff is a 77 year old woman who presents with  unstable anginal symptoms.  She ruled in for a non-Q-wave myocardial  infarction.  At catheterization, she had severe three-vessel coronary  artery disease with 95% stenosis in her LAD, total occlusion of her  right coronary, 70% stenosis in her circumflex, her ejection fraction  was 55%.  She was advised to undergo coronary artery bypass grafting.  The indications, risks, benefits, and alternatives were discussed in  detail with the patient.  She understood and accepted the risks and  agreed to proceed.   OPERATIVE NOTE:  Ms. Mcqueary was brought to the preop holding area on  September 07, 2007.  The Anesthesia established central venous access and  placed a Swan-Ganz catheter as well as an arterial blood pressure  monitoring line.  Intravenous antibiotics were administered.  She was  taken to the operating room, anesthetized, and intubated.  A Foley  catheter was placed.  The chest, abdomen, and legs were prepped and  draped in usual fashion.  Incision was made in the medial aspect of the  right leg at the level of the knee.  The greater saphenous vein was  harvested from upper calf to the groin.  Endoscopically, the saphenous  vein was of fair quality, relatively small, but appropriate for the  patient's age.  There were no significant varicosities.   Simultaneously, a median sternotomy was performed and the left internal  mammary artery was harvested using standard technique.  Mammary was  relatively small as well but was a good-quality vessel.  Heparin 2000  units was administered during the vessel harvest.  Remainder of the full  heparin dose was given prior to opening the pericardium.   The pericardium was opened.  The ascending aorta was inspected.  There  was calcific atherosclerotic disease at the site normally used for  aortic cannulation.  There was no significant atherosclerotic disease  more proximally where the vein grafts were to be placed.  The patient  was placed in Trendelenburg position and rotated to the right side.  The  coronary arteries were inspected to select anastomotic sites, and the  conduits were inspected and cut to length.  The Guidant Xpose system was  used to stabilize the heart during the performance of the off-pump  grafting.  Of note, a Bair Hugger was placed over the patient's lower  body and room temperature was kept near 80 degrees Fahrenheit.  The  apical suction was placed on the heart and the apex was retracted  anteriorly and superiorly exposing the posterior descending.  The  stabilizing bars were placed adjacent to the anastomotic site on the  posterior descending coronary arteries.  Silastic tapes were used  proximally and distally on all the  anastomoses, shunts were not  necessary.  An arteriotomy was made in the posterior descending.  This  was a 1.5-mm vessel's thick wall, but no significant stenosis or  atherosclerotic disease at the site of the anastomoses.  The vein graft  was anastomosed end-to-side with a running 7-0 Prolene suture.  At the  completion of the vein graft anastomoses, they were flushed with warm  heparinized saline ensuring that no air bubbles were entrapped to assess  flow.  Flow was satisfactory as was hemostasis.   Next, the apex of the heart was retracted to the right side and a  stabilizer was placed adjacent to the anastomotic site on the first  obtuse marginal branch of the left circumflex.  This vessel had some  moderate atherosclerotic disease at the site distal to the anastomosis.  The vein graft was anastomosed end-to-side with a running 7-0 Prolene  suture.  Again, there was satisfactory flow through the graft and good  hemostasis.   Next, the proximal anastomoses were performed for the vein grafts.  These were done using a 3.8-mm Hemashield 3 device.  A punch aortotomy  was performed in the aorta.  The Hemashield was placed within the aorta.  The anastomosis was performed and the Hemashield device was removed.  Next, the apex was retracted more inferiorly exposing the left anterior  descending.  The stabilizer was placed at the site of the anastomosis.  Arteriotomy was made and the left internal mammary artery was  anastomosed to the mid LAD with a running 8-0 Prolene suture.  Both were  1.5-mm vessels.  The LAD was good quality vessel at the site of the  anastomosis.  At completion of the mammary LAD anastomosis, bulldog  clamps were removed, de-aired prior to tying the suture.  The Silastic  tapes were removed as well.  The mammary pedicle was tacked to the  epicardial surface of the heart with 6-0 Prolene sutures.   The heart then was allowed to rest in its normal location.  A final   inspection for hemostasis was made on all proximal and distal  anastomoses.  A test dose of protamine was administered and was well  tolerated.  The remainder of the protamine was administered without any  hemodynamic consequences.  Epicardial pacing wires were placed on the  right ventricle and right atrium.  The patient was atrially paced at 90  beats per minute.  Hemostasis was achieved.  The chest was irrigated  with warm normal saline.  The pericardium was not closed.  The sternum  was closed with combination of single simple and figure-of-eight heavy  gauge stainless steel wires.  The pectoralis fascia, subcutaneous  tissue, and skin were closed in standard fashion.  All sponge,  needle,  and instrument counts were correct at the end of the procedure.  The  patient was taken from the operating room to the surgical intensive care  unit in good condition.      Salvatore Decent Dorris Fetch, M.D.  Electronically Signed    SCH/MEDQ  D:  09/07/2007  T:  09/08/2007  Job:  [id number]   cc:   Veverly Fells. Excell Seltzer, MD  Ilean Skill, MD

## 2010-08-10 NOTE — Assessment & Plan Note (Signed)
OFFICE VISIT   Hannah Estes, Hannah Estes  DOB:  Aug 23, 1925                                        October 05, 2007  CHART #:  [id number]   The patient is a 77 year old woman who presented with unstable angina.  She underwent off-pump coronary bypass grafting x3 on September 07, 2007.  Postoperatively, she had some transient atrial fibrillation.  She was  started on amiodarone and converted to sinus rhythm.  She was discharged  to [facility] but was then discharged home very  shortly thereafter.  She was seen in the office on September 27, 2007, by Dr.  Tyrone Sage, because of an infection at her chest tube site.  This was  treated with local wound care, and she was started on Keflex.  Her  cultures subsequently grew out methicillin-resistant Staphylococcus  aureus, and she was switched to Bactrim 1 tablet p.o. b.i.d.  She has  now had 5 days of that.   The patient complains of some soreness in her sternal area.  She has  never taken anything stronger than Tylenol.  She had some swelling in  her legs 1 day after rehab, but that resolved, and she has not had any  further difficulties with that.   PHYSICAL EXAMINATION:  GENERAL:  The patient is a 77 year old white  female in no acute distress.  VITAL SIGNS:  Her blood pressure is 151/79, pulse 72, respirations are  18, and her saturation 94% on room air.  LUNGS:  Clear with equal with breath sounds bilaterally.  CARDIAC:  Regular rate and rhythm.  Normal S1 and S2.  No murmurs, rubs,  or gallops.  CHEST:  Her sternum is stable.  She does have some tenderness to  palpation.  EXTREMITIES:  Her leg wound is well healed.  There is no peripheral  edema.  There is some seroma at the medial incision at the level of the  knee.   IMAGING STUDIES:  Chest x-ray shows good aeration of the lungs  bilaterally with no active disease.   IMPRESSION:  The patient is a 77 year old woman who is now about a month  out from  coronary bypass grafting.  She is doing well at this point in  time.  Her exercise tolerance is good.  She did not have any recurrent  angina.  She still has some sternal soreness, has been taking Tylenol 3  to 4 times daily for that.  She does not have to take any narcotics.  She is anxious to resume activities including ballroom dancing which she  does quite vigorously.  I recommend she wait about 3 more weeks before  attempting that, that will be October 27, 2007.  She is not to lift any  objects weighing greater than 10 pounds for at least another 2 weeks and  over the next few weeks, she can begin driving when she feels  comfortable.  Appropriate precautions were discussed.  She will continue  to be followed by Dr. Nona Dell in [place], [facility], as well  as by Dr. Dimas Aguas.  I would be happy to see her back at any time if I  could be of any further assistance with her care.   Salvatore Decent Dorris Fetch, M.D.  Electronically Signed   SCH/MEDQ  D:  10/05/2007  T:  10/06/2007  Job:  [id number]   cc:   Jonelle Sidle, MD

## 2010-08-10 NOTE — Consult Note (Signed)
NAMEBERNISE, SYLVAIN               ACCOUNT NO.:  [id number]   MEDICAL RECORD NO.:  [id number]         PATIENT TYPE:  CINP   LOCATION:                               FACILITY:  [facility]   PHYSICIAN:  Salvatore Decent. Dorris Fetch, M.D.DATE OF BIRTH:  1926-01-07   DATE OF CONSULTATION:  09/04/2007  DATE OF DISCHARGE:                                 CONSULTATION   HISTORY OF PRESENT ILLNESS:  Ms. Pho is a 77 year old woman who  presents with chest pain, shortness of breath, and ruled in for a non-Q-  wave myocardial infarction.   HISTORY OF PRESENT ILLNESS:  Ms. Tates is a 77 year old woman with no  prior history of coronary disease to her knowledge.  She states that  over the past couple of months she has noted a tightness in her chest  with exertion.  She is very active and dances about 15 hours a week, and  she did notice with more strenuous exertion while dancing she would get  tightness in her chest and occasionally pain in her arm as well.  She  does not have any what she terms pain in her chest.  She would be short  of breath and she attributed this to allergies.  Last Friday, she was  having little bit of shortness of breath.  She was doing some laundry  and while ironing and folding cloths she developed severe fatigue.  She  took a nap which she usually does not do and woke up short of breath and  with tightness in her chest and pain in her arm.  She finished her  laundry and then called her friend who took her to the emergency room  where she was found to have a non-Q-wave myocardial infarction.  She was  transferred to [facility] and yesterday underwent cardiac  catheterization which revealed severe 3-vessel coronary disease with  total occlusion of her right coronary and 95% stenosis in her LAD and  70% stenosis in her circumflex.  She had a 2-D echocardiogram which  showed an ejection fraction of 55%.  The patient's symptoms have  improved significantly since admission.  She  was given the option of  angioplasty versus coronary bypass grafting and elected to proceed with  bypass grafting.   Past medical history is significant for allergies, gastroesophageal  reflux, and question mild chronic obstructive pulmonary disease.   She states outpatient medications included multivitamins and Prilosec  over the counter.   Since admission, she has been started on Singulair 10 mg daily, aspirin  325 mg daily, Lopressor 25 mg b.i.d., Protonix 40 mg daily,  nitroglycerin paste 1 inch twice daily, Crestor 40 mg daily, as well as  heparin.   She has no known drug allergies.   Her family history is noncontributory in regards to cardiac disease.   SOCIAL HISTORY:  She is widowed twice.  She does have a boyfriend.  She  is very active and doing ballroom dancing approximately 15 hours a week.  She had a very light history of tobacco use, but quit over 20 years ago.   REVIEW OF SYSTEMS:  She has noted she has been fatiguing more easily and  been having some shortness of breath which she has attributed to  allergies.  All other systems are negative.   PHYSICAL EXAMINATION:  Ms. Frederic is a 77 year old white female in no  acute distress.  Her blood pressure 104/70, pulse is 72 and regular, and  respirations 16.  In general, she is well developed and well nourished.  Neurologically, she is alert and oriented x3, appropriate with no focal  deficits.  Her HEENT exam is unremarkable.  Neck is supple without  thyromegaly, adenopathy, or bruits.  Cardiac exam is regular rate and  rhythm.  Normal S1 and S2.  No rubs, murmurs, or gallops.  Lungs are  clear with equal breath sounds bilaterally.  Abdomen is soft and nontender.  Extremities without clubbing, cyanosis,  or edema.  She has brisk capillary refill.   LABORATORY DATA:  EKG shows ST inversion laterally and anteroseptal  infarct, age undetermined.  Cardiac catheterization is noted.  Echo  showed an ejection fraction of  55% with no significant valvular  pathology.  Her sodium is 139, potassium 4.1, BUN and creatinine 13 and  0.6, and glucose is 89.  White count is 9.4, hematocrit 36, and  platelets 214,000.  Troponin 6.93, CK 240, and MB 11.2.  Relative index  4.7.  Cholesterol is 210 with an LDL of 0.15, HDL of 69, and  triglycerides 132.  TSH was 5.  BNP was 439.   IMPRESSION:  Ms. Doubrava is a 77 year old active white female who  presents with about a 6 weeks' history of new onset angina and then  presented with an acute coronary syndrome and ruled in for non-Q-wave  myocardial infarction.  At catheterization, she has severe 3-vessel  coronary disease.  Ejection fraction is 55% by echocardiography.  Coronary bypass grafting is indicated for survival benefit as well as  relief of symptoms.  I have discussed with the patient the indications,  risks, benefits, and alternative treatments.  She understands the risks  of surgery include but are not limited to death, stroke, myocardial  infarction, deep vein thrombosis, pulmonary embolism, bleeding, possible  need for transfusions, infections, as well as other organ system  dysfunction including renal or gastrointestinal complications.  She  understands and accepts these risks and agrees to proceed.  Our first  available operating time is Friday, September 07, 2007, showed be the first  case of the day.      Salvatore Decent Dorris Fetch, M.D.  Electronically Signed     SCH/MEDQ  D:  09/04/2007  T:  09/05/2007  Job:  [id number]   cc:   Jonelle Sidle, MD

## 2010-08-10 NOTE — Assessment & Plan Note (Signed)
[facility]                          [facility] OFFICE NOTE   Hannah, Estes                        MRN:          [id number]  DATE:11/20/2007                            DOB:          December 24, 1925    PRIMARY CARE PHYSICIAN:  Dr. Selinda Flavin.   REASON FOR VISIT:  Cardiac followup.   HISTORY OF PRESENT ILLNESS:  Hannah Estes was seen back in July.  She is  status post coronary artery bypass grafting in June 2009 by Dr.  Dorris Fetch with a LIMA to left anterior descending, status vein graft  to obtuse marginal and saphenous vein graft posterior descending.  Ejection fraction has been normal at 55% with an area of periapical  hypokinesis.  She reports doing well overall.  She had an area of  cellulitis treated with antibiotics in her upper abdominal/lower left  thoracic area.  She reports this has improved significantly and she is  no longer on antibiotic therapy.  She has a problem with statin  intolerance including Lipitor and now Crestor.  It seems that she has  tolerated pravastatin, although she is not taking it regularly.  We  talked about this some today.  She has been more focused on over-the-  counter supplements rather than taking her statin with any regularity.  I reviewed with her lipid data including an LDL of 115 and discuss the  rationale for aggressive control in the setting of multivessel  cardiovascular disease.  She also has taken herself off of low-dose  Lopressor although seemingly was tolerating this at 12.5 mg p.o. b.i.d..  Higher doses major relatively hypotensive.  Given her postoperative  atrial fibrillation, we discussed continuing this for general heart rate  control.  She is in sinus rhythm on examination today.   ALLERGIES:  General intolerance to CRESTOR and LIPITOR.  No frank drug  allergy, however.   PRESENT MEDICATIONS:  1. Aspirin 81 mg p.o. daily.  2. Simvastatin 20 mg p.o. q.h.s. taken intermittently.  3.  Singulair 10 mg p.o. daily recently.  4. Lopressor 12.5 mg p.o. b.i.d.  5. Sublingual nitroglycerin 0.4 mg p.r.n.   REVIEW OF SYSTEMS:  As per history of present illness.   PHYSICAL EXAMINATION:  VITAL SIGNS:  Blood pressure is 129/84, heart  rate is in the 90s, and weight is 155 pounds.  GENERAL:  The patient is comfortable in no acute distress.  HEENT:  Conjunctiva is normal.  Oropharynx clear.  NECK:  Supple.  No elevated jugular venous pressure.  No loud bruits.  LUNGS:  Clear without labored breathing.  CHEST:  Chest wall shows a generally well-healing sternal incision.  No  drainage, erythema, including upper abdominal keyhole incisions.  CARDIAC:  Regular rate and rhythm.  No S3 gallop or pericardial rub.  ABDOMEN:  Soft and nontender.  EXTREMITIES:  Exhibit trace edema.  His pulses are 2+.  SKIN:  Warm and dry.  MUSCULOSKELETAL:  No kyphosis noted.  NEUROPSYCHIATRIC:  The patient is alert and oriented x3.   IMPRESSION AND RECOMMENDATIONS:  1. Multivessel cardiovascular disease with preserved ejection fraction  status post coronary artery bypass grafting in June 2009 as      outlined above.  The patient seems to be recuperating reasonably      well.  She will continue her present medications with the addition      of Lopressor 12.5 mg p.o. b.i.d. and also resumption of pravastatin      20 mg p.o. q.h.s. on a regular basis.  She has already gone back to      doing some dancing and I have encouraged to increase her physical      activity as tolerated.  2. In terms of her lipids, we will plan a followup lipid profile,      liver function tests once she has been consistently on her      Pravachol for at least 8 weeks.  Goal LDL should be around 70.     Jonelle Sidle, MD  Electronically Signed    SGM/MedQ  DD: 11/20/2007  DT: 11/21/2007  Job #: [id number]

## 2010-08-10 NOTE — H&P (Signed)
NAMENAHOMI, Hannah Estes               ACCOUNT NO.:  [id number]   MEDICAL RECORD NO.:  [id number]          PATIENT TYPE:  INP   LOCATION:  3304                         FACILITY:  [facility]   PHYSICIAN:  Doylene Canning. Ladona Ridgel, MD    DATE OF BIRTH:  [birth year]   DATE OF ADMISSION:  09/02/2007  DATE OF DISCHARGE:                              HISTORY & PHYSICAL   ADMISSION DIAGNOSIS:  Non-Q-wave myocardial infarction/unstable angina.   HISTORY OF PRESENT ILLNESS:  The patient is a very pleasant 77 year old  woman whose health has been very good.  She is married x2 and widowed  x2.  [place]  She has been very  active and dances 5 days a week for 2-3 hours per day.  Began in about 1  month ago, the patient noted that when she would be out dancing with her  boyfriend that she would get short of breath and had to stop and rest.  With rest, her symptoms of shortness of breath and chest pressure  resolved.  These episodes got worsened such that she presented to the  emergency department 2 days ago with all of the above.  There is no  radiation of her chest pain.  There is no diaphoresis.  She does have  mild nausea.  She was admitted to the hospital and initial cardiac  enzymes were elevated and she is admitted now for additional evaluation  and left heart catheterization.   PAST MEDICAL HISTORY:  Notable for gastroesophageal reflux disease.  She  has a history a very mild COPD for which she is on no medicines.  She  has no past surgical history.   SOCIAL HISTORY:  The patient is widowed as previously noted.  She has a  boyfriend of over 10 years.  She has a history of tobacco use and  stopped smoking 20 years ago.   FAMILY HISTORY:  Noncontributory at her advanced age.   MEDICATIONS:  Include multiple vitamins and Prilosec OTC.  She does not  have a primary physician.   REVIEW OF SYSTEMS:  Negative except as noted in the HPI.  She has very  minimal reflux  symptoms.   PHYSICAL EXAMINATION:  GENERAL:  Her physical exam is notable for her  being a pleasant, very well-appearing elderly woman, in no acute  distress.  VITAL SIGNS:  Blood pressure was 115/70, the pulse was 64 and regular,  and the respirations were 18.  HEENT:  Normocephalic and atraumatic.  Pupils were equal and round.  Oropharynx was moist.  The patient did wear glasses for visual acuity.  NECK:  Revealed no jugular venous distention.  There were no  thyromegaly.  Trachea was midline.  Carotids were 2+ and symmetric.  LUNGS:  Clear bilaterally to auscultation.  No wheezes, rales, or  rhonchi were present.  There was no increased work of breathing.  CARDIAC:  Regular rate and rhythm with normal S1 and S2.  There were  murmurs, rubs, or gallops present.  There was soft a S4 gallop present.  ABDOMINAL:  Soft, nontender, and nondistended.  There is no  organomegaly.  Bowel sounds present.  There is no rebound or guarding.  EXTREMITIES:  Demonstrated no cyanosis, clubbing, or edema.  The pulses  were 2+ and symmetric.  NEUROLOGIC:  Alert and oriented x3.  Her cranial nerves intact.  Strength was 5/5 and symmetric.   EKG demonstrates sinus rhythm and lateral ST-T depression (borderline).   IMPRESSION:  1. Non-Q-wave myocardial infarction/unstable angina.  2. Chest pain and shortness of breath secondary to non-Q-wave      myocardial infarction/unstable angina.  3. Gastroesophageal reflux disease, on a hydrogen pump blocker.   DISCUSSION:  We will plan to admit the patient and transfer to the  hospital.  We will continue her current cardiac medications.  We will  plan on proceeding with left heart catheterization at the earlier  possible convenient time, sooner should she have recurrent chest pain  today.      Doylene Canning. Ladona Ridgel, MD  Electronically Signed     GWT/MEDQ  D:  09/02/2007  T:  09/03/2007  Job:  [id number]

## 2010-08-10 NOTE — Discharge Summary (Signed)
NAMESEQUOIA, WITZ               ACCOUNT NO.:  [id number]   MEDICAL RECORD NO.:  [id number]          PATIENT TYPE:  INP   LOCATION:  2017                         FACILITY:  [facility]   PHYSICIAN:  Salvatore Decent. Dorris Fetch, M.D.DATE OF BIRTH:  07-21-1925   DATE OF ADMISSION:  09/02/2007  DATE OF DISCHARGE:                               DISCHARGE SUMMARY   DATE OF TRANSFER:  Date of transfer to nursing facility is pending.   HISTORY OF PRESENT ILLNESS:  The patient is a very pleasant 77 year old  female who has been in good health until recently.  Approximately 1  month ago, while dancing, she had an episode where she would get short  of breath and had to stop and rest.  With this rest, her symptoms of  shortness of breath and chest pressure would resolve.  The episodes did  progress with time.  She presented to the Emergency Department on August 31, 2007, at [facility] with similar symptoms.  There was no  radiation of the chest pain.  There was no diaphoresis.  She did have  some mild nausea.  She was admitted and initial cardiac enzymes were  elevated, and she was felt to require further evaluation and treatment  to include left heart catheterization.  She subsequently was transferred  to [facility] following catheterization where she was found to  have multi-vessel coronary artery disease and plans were to proceed with  surgical revascularization.   PAST MEDICAL HISTORY:  1. Gastroesophageal reflux disease.  2. History of mild COPD, on no medications.   PAST SURGICAL HISTORY:  None.   MEDICATIONS:  Medications prior to admission included multiple vitamins  as well as Prilosec OTC.   She does not have a primary physician.   FAMILY HISTORY, SOCIAL HISTORY, REVIEW OF SYSTEMS, PHYSICAL EXAM:  Please see the history and physical done at the time of admission.   HOSPITAL COURSE:  The patient was transferred on September 02, 2007, to [facility].  On September 03, 2007, she  underwent cardiac catheterization.  Findings include multiple lesions.  Please see the full report for  description.  Due to these findings, surgical consultation was obtained  with Charlett Lango, MD who evaluated the patient and studies.  Due  to the severe three-vessel disease with ejection fraction of 55% by  echocardiogram, she was felt to be a stable candidate, and on September 07, 2007, she was taken to the operating room at which time she underwent  the following procedure.   PROCEDURE:  Off-pump coronary artery bypass grafting x3.  The following  grafts were placed:  1. Left internal mammary artery to LAD.  2. Saphenous vein graft to the posterior descending.  3. Saphenous vein graft to the obtuse marginal.   The patient tolerated the procedure well and was taken to the surgical  intensive care unit in stable condition.   POSTOPERATIVE HOSPITAL COURSE:  The patient has overall done quite well.  She has remained hemodynamically stable.  She did have episode of rapid  atrial fibrillation and has subsequently been chemically cardioverted  to  a normal sinus rhythm with amiodarone.  She does have a moderate  postoperative anemia.  These values have stabilized.  Her most recent  hemoglobin and hematocrit dated September 10, 2007, are 9.6 and 27.9  respectively.  Electrolytes, BUN and creatinine are within normal  limits.  Incisions are healing well.  She is tolerating oxygen wean.  She does occasionally require it with ambulation.  She is having no  wheezing.  She is advancing in a routine manner using standard and  postoperative cardiac rehabilitation modalities.  She has responded to a  general diuresis.  She has no edema at this time.  Her weight is now at  approximately preop.  Overall, her status is felt to be tentatively  stable for transfer to the nursing facility in the next day or two  pending bed availability.   CONDITION ON DISCHARGE:  Stable, improving.   FINAL  DIAGNOSIS:  Severe three-vessel coronary artery disease with  unstable angina and recent non-ST segment elevation myocardial  infarction.   OTHER DIAGNOSES:  1. Hyperlipidemia.  2. Gastroesophageal reflux.  3. History of chronic obstructive pulmonary disease.  4. Remote history of tobacco abuse, quitting smoking 20 years ago.  5. Postoperative acute blood loss anemia.  6. Postoperative atrial fibrillation with subsequent conversion to      normal sinus rhythm.   MEDICATIONS ON DISCHARGE:  At the time of this dictation include the  following:  1. Protonix 40 mg daily.  2. Singulair 10 mg daily.  3. Crestor 40 mg daily.  4. Amiodarone 200 mg twice daily.  5. Aspirin 81 mg daily.  6. Lopressor 12.5 mg b.i.d.  7. For pain, oxycodone 5 mg 1 q.4 h. p.r.n. as needed for pain.  not able to start ACE inhibitor due to blood pressure, consider as  outpatient.   ACTIVITIES:  The patient should continue with PT and OT as available at  the facility.  She is encouraged to increase her ambulation with daily  increases in duration and distance.   WOUND CARE:  The patient may shower and gently clean her incisions with  soap and water.   Lifting restrictions are 10 pounds.  She is not to drive prior to being  seen by the surgeon.   FOLLOWUP:  The patient should have appointments arranged at the [facility] [phone] in 3 weeks with a chest x-  ray to be obtained at the [facility].  If you call our office,  they can make these arrangements.  Additionally, she should see Dr.  Excell Seltzer at [facility] in 2 weeks.  Phone number to make this  appointment, [phone].      Rowe Clack, P.A.-C.      Salvatore Decent Dorris Fetch, M.D.  Electronically Signed    WEG/MEDQ  D:  09/11/2007  T:  09/11/2007  Job:  [id number]   cc:   Veverly Fells. Excell Seltzer, MD

## 2010-10-24 ENCOUNTER — Other Ambulatory Visit: Payer: Self-pay | Admitting: Cardiology

## 2010-10-25 NOTE — Telephone Encounter (Signed)
[place] pt. 

## 2010-10-26 ENCOUNTER — Telehealth: Payer: Self-pay | Admitting: *Deleted

## 2010-10-26 DIAGNOSIS — I6529 Occlusion and stenosis of unspecified carotid artery: Secondary | ICD-10-CM

## 2010-10-26 MED ORDER — METOPROLOL TARTRATE 25 MG PO TABS: 25.0000 mg | ORAL_TABLET | Freq: Two times a day (BID) | ORAL | Status: DC

## 2010-10-26 MED ORDER — LISINOPRIL 5 MG PO TABS: 5.0000 mg | ORAL_TABLET | Freq: Every day | ORAL | Status: DC

## 2010-10-26 NOTE — Telephone Encounter (Signed)
Pt called stating she hasn't received a reminder yet but is due for something this month. According to recalls, pt needs carotid doppler and office visit. OV scheduled for Sept (first available) and Lynden Ang will schedule Carotid and notify pt of appt info.

## 2010-11-25 ENCOUNTER — Other Ambulatory Visit: Payer: Self-pay | Admitting: *Deleted

## 2010-11-25 MED ORDER — NITROGLYCERIN 0.4 MG SL SUBL: 0.4000 mg | SUBLINGUAL_TABLET | SUBLINGUAL | Status: DC | PRN

## 2010-12-01 ENCOUNTER — Other Ambulatory Visit: Payer: Self-pay | Admitting: *Deleted

## 2010-12-02 ENCOUNTER — Encounter (INDEPENDENT_AMBULATORY_CARE_PROVIDER_SITE_OTHER): Payer: Medicare Other | Admitting: *Deleted

## 2010-12-02 DIAGNOSIS — I6529 Occlusion and stenosis of unspecified carotid artery: Secondary | ICD-10-CM

## 2010-12-08 ENCOUNTER — Telehealth: Payer: Self-pay | Admitting: *Deleted

## 2010-12-08 NOTE — Telephone Encounter (Signed)
Pt notified of results and verbalized understanding.   Pt states she has had some SOB. She states she thinks it is allergies. She took her allergy pill today and decided to take it regularly. She states it helped her breathing. She states she will see primary MD if SOB worsens while on allergy pill. She has changed to Dr. Loney Hering for primary MD. She also states he took her off of her levothyroxine. She states it was getting too expensive. He had her stopped it for a few weeks. She states he then checked her Thyroid letter and it was "okay" so he told her to stay off of it.   She has an appt on 9/24 and plans to see [place] this day.

## 2010-12-08 NOTE — Telephone Encounter (Signed)
Message copied by Arlyss Gandy on Wed Dec 08, 2010  3:29 PM ------      Message from: MCDOWELL, Illene Bolus      Created: Fri Dec 03, 2010  3:49 PM       Mild to moderate carotid atherosclerosis - continue medical therapy and follow up carotid Doppler in one year.

## 2010-12-17 ENCOUNTER — Encounter: Payer: Self-pay | Admitting: Cardiology

## 2010-12-20 ENCOUNTER — Ambulatory Visit (INDEPENDENT_AMBULATORY_CARE_PROVIDER_SITE_OTHER): Payer: Medicare Other | Admitting: Cardiology

## 2010-12-20 ENCOUNTER — Encounter: Payer: Self-pay | Admitting: Cardiology

## 2010-12-20 VITALS — BP 122/83 | HR 58 | Ht 65.0 in | Wt 161.0 lb

## 2010-12-20 DIAGNOSIS — I6529 Occlusion and stenosis of unspecified carotid artery: Secondary | ICD-10-CM

## 2010-12-20 DIAGNOSIS — E785 Hyperlipidemia, unspecified: Secondary | ICD-10-CM

## 2010-12-20 DIAGNOSIS — I2510 Atherosclerotic heart disease of native coronary artery without angina pectoris: Secondary | ICD-10-CM

## 2010-12-20 NOTE — Patient Instructions (Signed)
   Carotid doppler before next visit Your physician wants you to follow up in:  1 year.  You will receive a reminder letter in the mail one-two months in advance.  If you don't receive a letter, please call our office to schedule the follow up appointment

## 2010-12-20 NOTE — Assessment & Plan Note (Signed)
Mild to moderate disease, on aspirin and statin therapy now. Followup carotid Dopplers for next visit.

## 2010-12-20 NOTE — Progress Notes (Signed)
Clinical Summary Hannah Estes is a 77 y.o.female presenting for followup. She was seen back in April.  She continues to do well, dancing regularly. Has stable NYHA class II dyspnea on exertion, no angina.  She reports that lipids were checked by Dr. Loney Hering since our last visit, and she is now back on simvastatin. She takes this intermittently related to feeling of muscle weakness at times.  Followup carotid Dopplers show relatively stable disease.   Allergies  Allergen Reactions  . Penicillins     Medication list reviewed.  Past Medical History  Diagnosis Date  . COPD (chronic obstructive pulmonary disease)   . Coronary artery disease     Multivessel, LVEF 65%  . Hyperlipidemia   . GERD (gastroesophageal reflux disease)   . Myocardial infarction     NSTEMI 6/09  . Carotid artery disease     Past Surgical History  Procedure Date  . Coronary artery bypass graft 2009    Off pump LIMA to LAD, SVG to first obtuse marginal , SVG to PDA      No family history on file.  Social History Hannah Estes reports that she quit smoking about 13 years ago. Her smoking use included Cigarettes. She has a 16 pack-year smoking history. She has never used smokeless tobacco. Hannah Estes reports that she does not drink alcohol.  Review of Systems No palpitations or syncope.  Physical Examination Filed Vitals:   12/20/10 0856  BP: 122/83  Pulse: 58   Overweight woman in no acute distress.  HEENT: Conjunctiva and lids normal, oropharynx with moist mucosa.  Neck: Supple, no elevated JVP, soft left bruit..  Lungs: Diminished breath sounds, slight end expiratory rhonchi. No active wheezing.  Cardiac: Regular rate and rhythm, no S3.  Thorax: Chest wall stable status post sternotomy.  Abdomen: Soft, nontender, bowel sounds present.  Skin: Warm and dry.  Musculoskeletal: No gross deformities.  Extremities: No pitting edema, distal pulses one plus.  Neuropsychiatric: Alert and oriented x3,  affect appropriate.   ECG Reviewed in EMR.  Studies Carotid Dopplers 12/02/2010: 40-59% LICA stenosis, 0-39% RICA stenosis.   Problem List and Plan

## 2010-12-20 NOTE — Assessment & Plan Note (Signed)
Symptomatically stable on medical therapy. Recommend continued exercise, observation. Warning signs and symptoms were discussed. Annual visit anticipated.

## 2010-12-20 NOTE — Assessment & Plan Note (Signed)
Lipids followed by Dr. Loney Hering. Patient is back on simvastatin.

## 2010-12-23 LAB — PROTIME-INR
INR: 1.2
Prothrombin Time: 13.2

## 2010-12-23 LAB — POCT I-STAT 4, (NA,K, GLUC, HGB,HCT)
Glucose, Bld: 161 — ABNORMAL HIGH
Glucose, Bld: 96
HCT: 29 — ABNORMAL LOW
HCT: 31 — ABNORMAL LOW
HCT: 31 — ABNORMAL LOW
Hemoglobin: 11.6 — ABNORMAL LOW
Hemoglobin: 9.9 — ABNORMAL LOW
Operator id: 3406
Operator id: 3406
Potassium: 4.1
Potassium: 4.6
Sodium: 134 — ABNORMAL LOW
Sodium: 136

## 2010-12-23 LAB — POCT I-STAT, CHEM 8
BUN: 13
Calcium, Ion: 1.17
Calcium, Ion: 1.21
Calcium, Ion: 1.23
Chloride: 100
Chloride: 102
Chloride: 95 — ABNORMAL LOW
HCT: 29 — ABNORMAL LOW
HCT: 30 — ABNORMAL LOW
HCT: 33 — ABNORMAL LOW
Potassium: 3.3 — ABNORMAL LOW
Potassium: 4.6
Sodium: 129 — ABNORMAL LOW
Sodium: 134 — ABNORMAL LOW
Sodium: 136
TCO2: 23

## 2010-12-23 LAB — BASIC METABOLIC PANEL
BUN: 10
BUN: 14
BUN: 9
BUN: 11
CO2: 24
CO2: 27
CO2: 27
CO2: 31
Calcium: 8.4
Calcium: 8.4
Calcium: 8.5
Calcium: 8.9
Calcium: 9
Calcium: 8.6
Chloride: 98
Chloride: 99
Creatinine, Ser: 0.51
Creatinine, Ser: 0.6
Creatinine, Ser: 0.62
Creatinine, Ser: 0.63
Creatinine, Ser: 0.69
GFR calc Af Amer: >60
GFR calc Af Amer: >60
GFR calc Af Amer: >60
GFR calc non Af Amer: >60
GFR calc non Af Amer: >60
GFR calc non Af Amer: >60
GFR calc non Af Amer: >60
Glucose, Bld: 101 — ABNORMAL HIGH
Glucose, Bld: 108 — ABNORMAL HIGH
Glucose, Bld: 89
Glucose, Bld: 96
Potassium: 3.8
Potassium: 4.1
Potassium: 3.9
Sodium: 129 — ABNORMAL LOW
Sodium: 137
Sodium: 137

## 2010-12-23 LAB — LIPID PANEL
Cholesterol: 210 — ABNORMAL HIGH
HDL: 69
Triglycerides: 132

## 2010-12-23 LAB — CBC
HCT: 27.4 — ABNORMAL LOW
HCT: 28 — ABNORMAL LOW
HCT: 32.2 — ABNORMAL LOW
HCT: 38
HCT: 27.9 — ABNORMAL LOW
Hemoglobin: 11.1 — ABNORMAL LOW
Hemoglobin: 13.2
Hemoglobin: 13.4
Hemoglobin: 9.7 — ABNORMAL LOW
Hemoglobin: 9.6 — ABNORMAL LOW
MCHC: 34.3
MCHC: 34.4
MCHC: 35.4
MCHC: 34.4
MCV: 91.1
MCV: 91.1
MCV: 91.4
MCV: 91.6
MCV: 91.7
MCV: 91.9
MCV: 91.9
MCV: 92.1
Platelets: 148 — ABNORMAL LOW
Platelets: 149 — ABNORMAL LOW
Platelets: 150
Platelets: 168
Platelets: 205
Platelets: 214
Platelets: 219
Platelets: 229
Platelets: 185
RBC: 3 — ABNORMAL LOW
RBC: 3.05 — ABNORMAL LOW
RBC: 3.53 — ABNORMAL LOW
RBC: 4.24
RBC: 3.03 — ABNORMAL LOW
RDW: 13.1
RDW: 13.2
RDW: 13.3
RDW: 13.3
RDW: 13.1
WBC: 10.4
WBC: 10.9 — ABNORMAL HIGH
WBC: 11.3 — ABNORMAL HIGH
WBC: 11.7 — ABNORMAL HIGH
WBC: 12.7 — ABNORMAL HIGH
WBC: 15.4 — ABNORMAL HIGH
WBC: 8.7
WBC: 9.8
WBC: 12 — ABNORMAL HIGH

## 2010-12-23 LAB — COMPREHENSIVE METABOLIC PANEL
ALT: 34
ALT: 40 — ABNORMAL HIGH
AST: 31
Albumin: 3.3 — ABNORMAL LOW
Alkaline Phosphatase: 50
Alkaline Phosphatase: 53
Chloride: 100
GFR calc Af Amer: >60
Glucose, Bld: 111 — ABNORMAL HIGH
Glucose, Bld: 87
Potassium: 4.1
Potassium: 4.5
Sodium: 137
Sodium: 137
Total Protein: 6.4
Total Protein: 6.7

## 2010-12-23 LAB — POCT I-STAT 3, ART BLOOD GAS (G3+)
Acid-Base Excess: 1
O2 Saturation: 98
Operator id: 305741
Operator id: 305741
Patient temperature: 37.5
pCO2 arterial: 39.8
pH, Arterial: 7.454 — ABNORMAL HIGH
pO2, Arterial: 65 — ABNORMAL LOW

## 2010-12-23 LAB — BLOOD GAS, ARTERIAL
Drawn by: 273391
O2 Content: 0.2
pCO2 arterial: 35.7
pH, Arterial: 7.435 — ABNORMAL HIGH
pO2, Arterial: 86.4

## 2010-12-23 LAB — CREATININE, SERUM
Creatinine, Ser: 0.65
GFR calc Af Amer: >60
GFR calc non Af Amer: >60
GFR calc non Af Amer: >60

## 2010-12-23 LAB — URINE MICROSCOPIC-ADD ON

## 2010-12-23 LAB — APTT: aPTT: 29

## 2010-12-23 LAB — CK TOTAL AND CKMB (NOT AT ARMC)
CK, MB: 11.2 — ABNORMAL HIGH
Total CK: 240 — ABNORMAL HIGH

## 2010-12-23 LAB — URINALYSIS, ROUTINE W REFLEX MICROSCOPIC
Ketones, ur: NEGATIVE
Protein, ur: NEGATIVE
Urobilinogen, UA: 1

## 2010-12-23 LAB — ABO/RH: ABO/RH(D): A POS

## 2010-12-23 LAB — CARDIAC PANEL(CRET KIN+CKTOT+MB+TROPI)
Relative Index: 9.2 — ABNORMAL HIGH
Total CK: 466 — ABNORMAL HIGH

## 2010-12-23 LAB — HEPARIN LEVEL (UNFRACTIONATED)
Heparin Unfractionated: 0.62
Heparin Unfractionated: 0.69

## 2010-12-23 LAB — MAGNESIUM
Magnesium: 2.1
Magnesium: 2.1
Magnesium: 2.7 — ABNORMAL HIGH

## 2010-12-23 LAB — POCT I-STAT GLUCOSE: Glucose, Bld: 157 — ABNORMAL HIGH

## 2010-12-23 LAB — B-NATRIURETIC PEPTIDE (CONVERTED LAB): Pro B Natriuretic peptide (BNP): 439 — ABNORMAL HIGH

## 2011-02-25 ENCOUNTER — Other Ambulatory Visit: Payer: Self-pay | Admitting: *Deleted

## 2011-07-01 ENCOUNTER — Other Ambulatory Visit: Payer: Self-pay | Admitting: Internal Medicine

## 2011-07-13 ENCOUNTER — Encounter: Payer: Self-pay | Admitting: Physician Assistant

## 2011-07-13 ENCOUNTER — Ambulatory Visit (INDEPENDENT_AMBULATORY_CARE_PROVIDER_SITE_OTHER): Payer: Medicare Other | Admitting: Physician Assistant

## 2011-07-13 VITALS — BP 106/66 | HR 76 | Ht 65.0 in | Wt 162.0 lb

## 2011-07-13 DIAGNOSIS — E785 Hyperlipidemia, unspecified: Secondary | ICD-10-CM

## 2011-07-13 DIAGNOSIS — I2510 Atherosclerotic heart disease of native coronary artery without angina pectoris: Secondary | ICD-10-CM

## 2011-07-13 DIAGNOSIS — I6529 Occlusion and stenosis of unspecified carotid artery: Secondary | ICD-10-CM

## 2011-07-13 DIAGNOSIS — R55 Syncope and collapse: Secondary | ICD-10-CM | POA: Insufficient documentation

## 2011-07-13 MED ORDER — METOPROLOL SUCCINATE ER 25 MG PO TB24: 25.0000 mg | ORAL_TABLET | Freq: Every day | ORAL | Status: DC

## 2011-07-13 NOTE — Assessment & Plan Note (Signed)
Probable etiology due to significant hypotension, secondary to nitroglycerin. Given that her current blood pressure is relatively low, however, I recommend decreasing beta blocker to a total of 25 mg daily. She can start Toprol-XL 25 daily, after she finishes her current dose of Lopressor. Moreover, she informs me that she does not always take her p.m. dose of Lopressor, citing occasional low systolic readings. She can otherwise continue her current low-dose of ACE inhibitor, even though she has normal LVF, given its overall benefit on generalized atherosclerosis.

## 2011-07-13 NOTE — Assessment & Plan Note (Signed)
We'll order surveillance Dopplers for this September, 2013. Previous study, 9/12, notable for 40-59% LICA; 0-39% on the right.

## 2011-07-13 NOTE — Assessment & Plan Note (Signed)
No further workup indicated. Patient denies any exertional CP, and recent episode most likely was related to GI etiology.

## 2011-07-13 NOTE — Assessment & Plan Note (Signed)
Followed by Dr. Loney Hering. I advised her to monitor her levels, particularly trying to maintain an LDL of 70 or less, if feasible.

## 2011-07-13 NOTE — Progress Notes (Signed)
HPI:  Patient presents for post hospital followup, following recent brief admission here at [facility].  Patient was admitted for evaluation of syncope and CP, and we were not formally consulted. She ruled out for MI with normal markers, and did not demonstrate any dysrhythmia on telemetry. A 2-D echo was not obtained. Her syncopal episode was attributed to side effect of NTG.  Specifically, she developed midepigastric pain while dancing, and subsequently took 2 NTG tablets. While seated, she slumped over and was caught by her friends. She did not fall to the floor. She had never experienced this before. She recalls some nausea prior to the episode. She attributes the CP to having had a "bad hot dog", several hours earlier.  Since discharge, she has not had any recurrent episodes. She continues to report no exertional CP, as she had done in the past. She has resumed dancing. She denies any positional symptoms.  She typically has BPs readings in the 120 systolic range, occasionally 140s. She denies any readings below 100 systolic. Pertaining to the recent episode, she reports that EMS took a reading of "188/32", in the field.  Allergies  Allergen Reactions  . Penicillins     Current Outpatient Prescriptions  Medication Sig Dispense Refill  . albuterol (PROAIR HFA) 108 (90 BASE) MCG/ACT inhaler Inhale 2 puffs into the lungs every 4 (four) hours as needed.      Marland Kitchen aspirin 81 MG tablet Take 81 mg by mouth daily.        . budesonide-formoterol (SYMBICORT) 80-4.5 MCG/ACT inhaler Inhale 1 puff into the lungs 2 (two) times daily.       . calcium carbonate (OS-CAL) 600 MG TABS Take 600 mg by mouth 2 (two) times daily with a meal.        . cetirizine (ZYRTEC) 10 MG tablet Take 10 mg by mouth daily as needed.       . Garlic 500 MG CAPS Take 1 capsule by mouth daily.       Marland Kitchen levothyroxine (SYNTHROID, LEVOTHROID) 25 MCG tablet Take 1 tablet by mouth Daily.      Marland Kitchen lisinopril (PRINIVIL,ZESTRIL) 5 MG tablet  Take 1 tablet (5 mg total) by mouth daily.  30 tablet  3  . metoprolol tartrate (LOPRESSOR) 25 MG tablet Take 25 mg by mouth 2 (two) times daily.      . Multiple Vitamins-Minerals (MULTIVITAMIN WITH MINERALS) tablet Take 1 tablet by mouth daily.        . Multiple Vitamins-Minerals (STRESS B COMPLEX/ZINC PO) Take 1 tablet by mouth daily. OCUVITE      . nitroGLYCERIN (NITROSTAT) 0.4 MG SL tablet Place 1 tablet (0.4 mg total) under the tongue every 5 (five) minutes as needed.  25 tablet  3  . NON FORMULARY Take 2 tablets by mouth daily. cholest wise      . Omega-3 Fatty Acids (FISH OIL TRIPLE STRENGTH) 1360 MG CAPS Take 2 capsules by mouth daily.         Past Medical History  Diagnosis Date  . COPD (chronic obstructive pulmonary disease)   . Coronary artery disease     Multivessel, LVEF 65%  . Hyperlipidemia   . GERD (gastroesophageal reflux disease)   . Myocardial infarction     NSTEMI 6/09  . Carotid artery disease     Past Surgical History  Procedure Date  . Coronary artery bypass graft 2009    Off pump LIMA to LAD, SVG to first obtuse marginal , SVG to PDA  History   Social History  . Marital Status: Widowed    Spouse Name: N/A    Number of Children: N/A  . Years of Education: N/A   Occupational History  . Retired    Social History Main Topics  . Smoking status: Former Smoker -- 0.4 packs/day for 40 years    Types: Cigarettes    Quit date: 03/28/1997  . Smokeless tobacco: Never Used   Comment: smoked 4 cigarettes per day  . Alcohol Use: No  . Drug Use: No  . Sexually Active: Not on file   Other Topics Concern  . Not on file   Social History Narrative  . No narrative on file    No family history on file.  ROS: no nausea, vomiting; no fever, chills; no melena, hematochezia; no claudication  PHYSICAL EXAM: BP 106/66  Pulse 76  Ht 5\' 5"  (1.651 m)  Wt 162 lb (73.483 kg)  BMI 26.96 kg/m2 GENERAL: 78 year old female; NAD HEENT: NCAT, PERRLA, EOMI;  sclera clear; no xanthelasma NECK: palpable bilateral carotid pulses, bilateral carotid bruits (R > L); no JVD; no TM LUNGS: CTA bilaterally CARDIAC: RRR (S1, S2); no significant murmurs; no rubs or gallops ABDOMEN: soft, non-tender; intact BS EXTREMETIES: intact distal pulses; no significant peripheral edema SKIN: warm/dry; no obvious rash/lesions MUSCULOSKELETAL: no joint deformity NEURO: no focal deficit; NL affect   EKG:    ASSESSMENT & PLAN:

## 2011-07-13 NOTE — Patient Instructions (Signed)
   Finish current supply of Lopressor  After above, begin Toprol XL 25mg  daily  Carotid Doppler - due September 2013 - will send reminder in mail Your physician wants you to follow up in: 6 months.  You will receive a reminder letter in the mail one-two months in advance.  If you don't receive a letter, please call our office to schedule the follow up appointment

## 2011-11-28 ENCOUNTER — Other Ambulatory Visit: Payer: Self-pay | Admitting: Cardiology

## 2011-12-15 ENCOUNTER — Other Ambulatory Visit: Payer: Self-pay | Admitting: *Deleted

## 2011-12-15 DIAGNOSIS — I6529 Occlusion and stenosis of unspecified carotid artery: Secondary | ICD-10-CM

## 2011-12-21 ENCOUNTER — Encounter (INDEPENDENT_AMBULATORY_CARE_PROVIDER_SITE_OTHER): Payer: Medicare Other

## 2011-12-21 DIAGNOSIS — I6529 Occlusion and stenosis of unspecified carotid artery: Secondary | ICD-10-CM

## 2011-12-22 ENCOUNTER — Ambulatory Visit: Payer: Medicare Other | Admitting: Cardiology

## 2011-12-23 ENCOUNTER — Ambulatory Visit: Payer: Medicare Other | Admitting: Cardiology

## 2011-12-23 ENCOUNTER — Telehealth: Payer: Self-pay | Admitting: *Deleted

## 2011-12-23 NOTE — Telephone Encounter (Signed)
Message copied by Lesle Chris on Fri Dec 23, 2011  9:45 AM ------      Message from: Jonelle Sidle      Created: Fri Dec 23, 2011  9:39 AM       Reviewed. These were ordered by Mr. Serpe. Patient has stable, bilateral ICA disease, mild to moderate as noted. Continue medical therapy.

## 2011-12-23 NOTE — Telephone Encounter (Signed)
Notes Recorded by Lesle Chris, LPN on 04/27/8655 at 9:45 AM Patient notified.

## 2012-01-30 ENCOUNTER — Encounter: Payer: Self-pay | Admitting: Cardiology

## 2012-01-30 ENCOUNTER — Ambulatory Visit (INDEPENDENT_AMBULATORY_CARE_PROVIDER_SITE_OTHER): Payer: Medicare Other | Admitting: Cardiology

## 2012-01-30 VITALS — BP 111/67 | HR 72 | Ht 65.0 in | Wt 169.4 lb

## 2012-01-30 DIAGNOSIS — I2510 Atherosclerotic heart disease of native coronary artery without angina pectoris: Secondary | ICD-10-CM

## 2012-01-30 DIAGNOSIS — I6529 Occlusion and stenosis of unspecified carotid artery: Secondary | ICD-10-CM

## 2012-01-30 DIAGNOSIS — E785 Hyperlipidemia, unspecified: Secondary | ICD-10-CM

## 2012-01-30 NOTE — Assessment & Plan Note (Signed)
Encouraged her to make a follow up visit with Dr. Loney Hering. She is not taking any statin preparation at this time, is on omega-3 supplements. She had trouble with Zocor previously. Depending on her followup lab work, might consider a different formulation such as Lipitor or Pravachol. Ideally her LDL should be close to 70.

## 2012-01-30 NOTE — Assessment & Plan Note (Signed)
Doing well, symptomatically stable on current medical regimen. No changes made today. I encouraged her to remain active. Followup in one year.

## 2012-01-30 NOTE — Progress Notes (Signed)
Clinical Summary Ms. Guitron is a 78 y.o.female presenting for followup. She was seen in April. Medications were adjusted at that time. She has done very well. She still dances at least 4 days a week. Reports no angina symptoms.  Recent carotid Dopplers in September demonstrated 0-39% RICA stenosis and 40-59% LICA stenosis. We revewed these again today.  She has not seen Dr. Loney Hering for a routine physical. I encouraged her to check in with her primary care provider.   Allergies  Allergen Reactions  . Penicillins     Current Outpatient Prescriptions  Medication Sig Dispense Refill  . aspirin 81 MG tablet Take 81 mg by mouth daily.        . budesonide-formoterol (SYMBICORT) 80-4.5 MCG/ACT inhaler Inhale 1 puff into the lungs 2 (two) times daily.       . Garlic 500 MG CAPS Take 1 capsule by mouth daily.       Marland Kitchen lisinopril (PRINIVIL,ZESTRIL) 5 MG tablet TAKE 1 TABLET ONCE DAILY.  30 tablet  6  . metoprolol succinate (TOPROL-XL) 25 MG 24 hr tablet Take 1 tablet (25 mg total) by mouth daily.  30 tablet  6  . Multiple Vitamins-Minerals (MULTIVITAMIN WITH MINERALS) tablet Take 1 tablet by mouth daily.        . Multiple Vitamins-Minerals (STRESS B COMPLEX/ZINC PO) Take 1 tablet by mouth daily. OCUVITE      . nitroGLYCERIN (NITROSTAT) 0.4 MG SL tablet Place 1 tablet (0.4 mg total) under the tongue every 5 (five) minutes as needed.  25 tablet  3  . NON FORMULARY Take 2 tablets by mouth daily. cholest wise      . Omega-3 Fatty Acids (FISH OIL TRIPLE STRENGTH) 1360 MG CAPS Take 2 capsules by mouth daily.         Past Medical History  Diagnosis Date  . COPD (chronic obstructive pulmonary disease)   . Coronary artery disease     Multivessel, LVEF 65%  . Hyperlipidemia   . GERD (gastroesophageal reflux disease)   . Myocardial infarction     NSTEMI 6/09  . Carotid artery disease     Past Surgical History  Procedure Date  . Coronary artery bypass graft 2009    Off pump LIMA to LAD, SVG to  first obtuse marginal , SVG to PDA      Social History Ms. Rosengren reports that she quit smoking about 14 years ago. Her smoking use included Cigarettes. She has a 16 pack-year smoking history. She has never used smokeless tobacco. Ms. Will reports that she does not drink alcohol.  Review of Systems No dizziness, no syncope since previous episode after taking nitroglycerin. No orthopnea or PND. Stable appetite. No reported bleeding episodes. Otherwise negative.  Physical Examination Filed Vitals:   01/30/12 1418  BP: 111/67  Pulse: 72   Filed Weights   01/30/12 1418  Weight: 169 lb 6.4 oz (76.839 kg)    HEENT: Conjunctiva and lids normal, oropharynx with moist mucosa.  Neck: Supple, no elevated JVP, soft left bruit..  Lungs: Diminished breath sounds, slight end expiratory rhonchi. No active wheezing.  Cardiac: Regular rate and rhythm, no S3.  Thorax: Chest wall stable status post sternotomy.   Skin: Warm and dry.  Musculoskeletal: No gross deformities.  Extremities: No pitting edema, distal pulses one plus.    Problem List and Plan   CORONARY ATHEROSCLEROSIS NATIVE CORONARY ARTERY Doing well, symptomatically stable on current medical regimen. No changes made today. I encouraged her to remain active.  Followup in one year.  CAROTID ARTERY DISEASE Stable by most recent Doppler.  HYPERLIPIDEMIA-MIXED Encouraged her to make a follow up visit with Dr. Loney Hering. She is not taking any statin preparation at this time, is on omega-3 supplements. She had trouble with Zocor previously. Depending on her followup lab work, might consider a different formulation such as Lipitor or Pravachol. Ideally her LDL should be close to 70.    Jonelle Sidle, M.D., F.A.C.C.

## 2012-01-30 NOTE — Patient Instructions (Addendum)

## 2012-01-30 NOTE — Assessment & Plan Note (Signed)
Stable by most recent Doppler.

## 2012-02-20 ENCOUNTER — Encounter (HOSPITAL_COMMUNITY): Payer: Self-pay | Admitting: Pharmacy Technician

## 2012-02-22 MED ORDER — TETRACAINE HCL 0.5 % OP SOLN: 1.0000 [drp] | OPHTHALMIC | Status: DC

## 2012-02-22 MED ORDER — LIDOCAINE HCL 3.5 % OP GEL: 1.0000 "application " | Freq: Once | OPHTHALMIC | Status: DC

## 2012-02-22 MED ORDER — PHENYLEPHRINE HCL 2.5 % OP SOLN: 1.0000 [drp] | OPHTHALMIC | Status: DC

## 2012-02-22 MED ORDER — CYCLOPENTOLATE-PHENYLEPHRINE 0.2-1 % OP SOLN: 1.0000 [drp] | OPHTHALMIC | Status: DC

## 2012-02-22 NOTE — Patient Instructions (Signed)
20 Hanadi Stanly  02/22/2012   Your procedure is scheduled on:  03/05/12  Report to [facility] at [place] AM.  Call this number if you have problems the morning of surgery: [phone]   Remember:   Do not eat food:After Midnight.  May have clear liquids:until Midnight .  Clear liquids include soda, tea, black coffee, apple or grape juice, broth.  Take these medicines the morning of surgery with A SIP OF WATER:lisinopril, metoprolol.  Use symbicort inhaler & bring it with you.   Do not wear jewelry, make-up or nail polish.  Do not wear lotions, powders, or perfumes. You may wear deodorant.  Do not shave 48 hours prior to surgery. Men may shave face and neck.  Do not bring valuables to the hospital.  Contacts, dentures or bridgework may not be worn into surgery.  Leave suitcase in the car. After surgery it may be brought to your room.  For patients admitted to the hospital, checkout time is 11:00 AM the day of discharge.   Patients discharged the day of surgery will not be allowed to drive home.  Name and phone number of your driver: family  Special Instructions: N/A   Please read over the following fact sheets that you were given: Anesthesia Post-op Instructions and Care and Recovery After Surgery   PATIENT INSTRUCTIONS POST-ANESTHESIA  IMMEDIATELY FOLLOWING SURGERY:  Do not drive or operate machinery for the first twenty four hours after surgery.  Do not make any important decisions for twenty four hours after surgery or while taking narcotic pain medications or sedatives.  If you develop intractable nausea and vomiting or a severe headache please notify your doctor immediately.  FOLLOW-UP:  Please make an appointment with your surgeon as instructed. You do not need to follow up with anesthesia unless specifically instructed to do so.  WOUND CARE INSTRUCTIONS (if applicable):  Keep a dry clean dressing on the anesthesia/puncture wound site if there is drainage.  Once the wound has quit  draining you may leave it open to air.  Generally you should leave the bandage intact for twenty four hours unless there is drainage.  If the epidural site drains for more than 36-48 hours please call the anesthesia department.  QUESTIONS?:  Please feel free to call your physician or the hospital operator if you have any questions, and they will be happy to assist you.      Cataract Surgery  A cataract is a clouding of the lens of the eye. When a lens becomes cloudy, vision is reduced based on the degree and nature of the clouding. Surgery may be needed to improve vision. Surgery removes the cloudy lens and usually replaces it with a substitute lens (intraocular lens, IOL). LET YOUR EYE DOCTOR KNOW ABOUT:  Allergies to food or medicine.  Medicines taken including herbs, eyedrops, over-the-counter medicines, and creams.  Use of steroids (by mouth or creams).  Previous problems with anesthetics or numbing medicine.  History of bleeding problems or blood clots.  Previous surgery.  Other health problems, including diabetes and kidney problems.  Possibility of pregnancy, if this applies. RISKS AND COMPLICATIONS  Infection.  Inflammation of the eyeball (endophthalmitis) that can spread to both eyes (sympathetic ophthalmia).  Poor wound healing.  If an IOL is inserted, it can later fall out of proper position. This is very uncommon.  Clouding of the part of your eye that holds an IOL in place. This is called an "after-cataract." These are uncommon, but easily treated. BEFORE  THE PROCEDURE  Do not eat or drink anything except small amounts of water for 8 to 12 before your surgery, or as directed by your caregiver.  Unless you are told otherwise, continue any eyedrops you have been prescribed.  Talk to your primary caregiver about all other medicines that you take (both prescription and non-prescription). In some cases, you may need to stop or change medicines near the time of your  surgery. This is most important if you are taking blood-thinning medicine.Do not stop medicines unless you are told to do so.  Arrange for someone to drive you to and from the procedure.  Do not put contact lenses in either eye on the day of your surgery. PROCEDURE There is more than one method for safely removing a cataract. Your doctor can explain the differences and help determine which is best for you. Phacoemulsification surgery is the most common form of cataract surgery.  An injection is given behind the eye or eyedrops are given to make this a painless procedure.  A small cut (incision) is made on the edge of the clear, dome-shaped surface that covers the front of the eye (cornea).  A tiny probe is painlessly inserted into the eye. This device gives off ultrasound waves that soften and break up the cloudy center of the lens. This makes it easier for the cloudy lens to be removed by suction.  An IOL may be implanted.  The normal lens of the eye is covered by a clear capsule. Part of that capsule is intentionally left in the eye to support the IOL.  Your surgeon may or may not use stitches to close the incision. There are other forms of cataract surgery that require a larger incision and stiches to close the eye. This approach is taken in cases where the doctor feels that the cataract cannot be easily removed using phacoemulsification. AFTER THE PROCEDURE  When an IOL is implanted, it does not need care. It becomes a permanent part of your eye and cannot be seen or felt.  Your doctor will schedule follow-up exams to check on your progress.  Review your other medicines with your doctor to see which can be resumed after surgery.  Use eyedrops or take medicine as prescribed by your doctor. Document Released: 03/03/2011 Document Revised: 06/06/2011 Document Reviewed: 03/03/2011 [facility] Patient Information 2013 [place], [place].

## 2012-02-27 ENCOUNTER — Encounter (HOSPITAL_COMMUNITY): Payer: Self-pay

## 2012-02-27 ENCOUNTER — Encounter (HOSPITAL_COMMUNITY)
Admission: RE | Admit: 2012-02-27 | Discharge: 2012-02-27 | Disposition: A | Discharge status: Home / Self Care | Payer: Medicare Other | Source: Ambulatory Visit | Attending: Ophthalmology | Admitting: Ophthalmology

## 2012-02-27 LAB — HEMOGLOBIN AND HEMATOCRIT, BLOOD
HCT: 40.2 % (ref 36.0–46.0)
Hemoglobin: 13.7 g/dL (ref 12.0–15.0)

## 2012-02-27 LAB — BASIC METABOLIC PANEL
BUN: 13 mg/dL (ref 6–23)
Calcium: 10 mg/dL (ref 8.4–10.5)
Chloride: 98 mEq/L (ref 96–112)
Creatinine, Ser: 0.64 mg/dL (ref 0.50–1.10)
GFR calc Af Amer: >90 mL/min (ref >90)
GFR calc non Af Amer: 80 mL/min — ABNORMAL LOW (ref >90)

## 2012-02-29 ENCOUNTER — Other Ambulatory Visit: Payer: Self-pay | Admitting: Physician Assistant

## 2012-03-02 MED ORDER — PHENYLEPHRINE HCL 2.5 % OP SOLN
OPHTHALMIC | Status: AC
  Filled 2012-03-02: qty 2

## 2012-03-02 MED ORDER — CYCLOPENTOLATE-PHENYLEPHRINE 0.2-1 % OP SOLN
OPHTHALMIC | Status: AC
  Filled 2012-03-02: qty 2

## 2012-03-02 MED ORDER — LIDOCAINE HCL 3.5 % OP GEL
OPHTHALMIC | Status: AC
  Filled 2012-03-02: qty 5

## 2012-03-02 MED ORDER — NEOMYCIN-POLYMYXIN-DEXAMETH 3.5-10000-0.1 OP OINT
TOPICAL_OINTMENT | OPHTHALMIC | Status: AC
  Filled 2012-03-02: qty 3.5

## 2012-03-02 MED ORDER — TETRACAINE HCL 0.5 % OP SOLN
OPHTHALMIC | Status: AC
  Filled 2012-03-02: qty 2

## 2012-03-02 MED ORDER — LIDOCAINE HCL (PF) 1 % IJ SOLN
INTRAMUSCULAR | Status: AC
  Filled 2012-03-02: qty 2

## 2012-03-05 ENCOUNTER — Ambulatory Visit (HOSPITAL_COMMUNITY): Payer: Medicare Other | Admitting: Anesthesiology

## 2012-03-05 ENCOUNTER — Encounter (HOSPITAL_COMMUNITY): Payer: Self-pay | Admitting: *Deleted

## 2012-03-05 ENCOUNTER — Ambulatory Visit (HOSPITAL_COMMUNITY)
Admission: RE | Admit: 2012-03-05 | Discharge: 2012-03-05 | Disposition: A | Discharge status: Home / Self Care | Payer: Medicare Other | Source: Ambulatory Visit | Attending: Ophthalmology | Admitting: Ophthalmology

## 2012-03-05 ENCOUNTER — Encounter (HOSPITAL_COMMUNITY)
Admission: RE | Disposition: A | Discharge status: Home / Self Care | Payer: Self-pay | Source: Ambulatory Visit | Attending: Ophthalmology

## 2012-03-05 ENCOUNTER — Encounter (HOSPITAL_COMMUNITY): Payer: Self-pay | Admitting: Anesthesiology

## 2012-03-05 DIAGNOSIS — I739 Peripheral vascular disease, unspecified: Secondary | ICD-10-CM | POA: Insufficient documentation

## 2012-03-05 DIAGNOSIS — I2510 Atherosclerotic heart disease of native coronary artery without angina pectoris: Secondary | ICD-10-CM | POA: Insufficient documentation

## 2012-03-05 DIAGNOSIS — J4489 Other specified chronic obstructive pulmonary disease: Secondary | ICD-10-CM | POA: Insufficient documentation

## 2012-03-05 DIAGNOSIS — J449 Chronic obstructive pulmonary disease, unspecified: Secondary | ICD-10-CM | POA: Insufficient documentation

## 2012-03-05 DIAGNOSIS — H2589 Other age-related cataract: Secondary | ICD-10-CM | POA: Insufficient documentation

## 2012-03-05 DIAGNOSIS — Z01812 Encounter for preprocedural laboratory examination: Secondary | ICD-10-CM | POA: Insufficient documentation

## 2012-03-05 HISTORY — PX: CATARACT EXTRACTION W/PHACO: SHX586

## 2012-03-05 SURGERY — PHACOEMULSIFICATION, CATARACT, WITH IOL INSERTION
Anesthesia: Monitor Anesthesia Care | Site: Eye | Laterality: Left | Wound class: Clean

## 2012-03-05 MED ORDER — LIDOCAINE HCL 3.5 % OP GEL
1.0000 "application " | Freq: Once | OPHTHALMIC | Status: AC
  Administered 2012-03-05: 1 via OPHTHALMIC

## 2012-03-05 MED ORDER — MIDAZOLAM HCL 2 MG/2ML IJ SOLN
1.0000 mg | INTRAMUSCULAR | Status: DC | PRN
  Administered 2012-03-05: 2 mg via INTRAVENOUS

## 2012-03-05 MED ORDER — NEOMYCIN-POLYMYXIN-DEXAMETH 0.1 % OP OINT
TOPICAL_OINTMENT | OPHTHALMIC | Status: AC | PRN
  Administered 2012-03-05: 1 via OPHTHALMIC

## 2012-03-05 MED ORDER — LIDOCAINE HCL (PF) 1 % IJ SOLN
INTRAMUSCULAR | Status: DC | PRN
  Administered 2012-03-05: .3 mL

## 2012-03-05 MED ORDER — EPINEPHRINE HCL 1 MG/ML IJ SOLN
INTRAMUSCULAR | Status: AC
  Filled 2012-03-05: qty 1

## 2012-03-05 MED ORDER — MIDAZOLAM HCL 2 MG/2ML IJ SOLN
INTRAMUSCULAR | Status: AC
  Filled 2012-03-05: qty 2

## 2012-03-05 MED ORDER — LACTATED RINGERS IV SOLN
INTRAVENOUS | Status: DC
  Administered 2012-03-05: 1000 mL via INTRAVENOUS

## 2012-03-05 MED ORDER — EPINEPHRINE HCL 1 MG/ML IJ SOLN
INTRAOCULAR | Status: DC | PRN
  Administered 2012-03-05: 08:00:00

## 2012-03-05 MED ORDER — LIDOCAINE HCL (PF) 1 % IJ SOLN
INTRAMUSCULAR | Status: AC
  Filled 2012-03-05: qty 2

## 2012-03-05 MED ORDER — CYCLOPENTOLATE-PHENYLEPHRINE 0.2-1 % OP SOLN
1.0000 [drp] | OPHTHALMIC | Status: AC
  Administered 2012-03-05 (×3): 1 [drp] via OPHTHALMIC

## 2012-03-05 MED ORDER — POVIDONE-IODINE 5 % OP SOLN
OPHTHALMIC | Status: DC | PRN
  Administered 2012-03-05: 1 via OPHTHALMIC

## 2012-03-05 MED ORDER — TETRACAINE HCL 0.5 % OP SOLN
1.0000 [drp] | OPHTHALMIC | Status: AC
  Administered 2012-03-05 (×3): 1 [drp] via OPHTHALMIC

## 2012-03-05 MED ORDER — TRYPAN BLUE 0.06 % OP SOLN
OPHTHALMIC | Status: AC
  Filled 2012-03-05: qty 0.5

## 2012-03-05 MED ORDER — PROVISC 10 MG/ML IO SOLN
INTRAOCULAR | Status: DC | PRN
  Administered 2012-03-05: 8.5 mg via INTRAOCULAR

## 2012-03-05 MED ORDER — CARBACHOL 0.01 % IO SOLN
INTRAOCULAR | Status: AC
  Filled 2012-03-05: qty 1.5

## 2012-03-05 MED ORDER — PHENYLEPHRINE HCL 2.5 % OP SOLN
1.0000 [drp] | OPHTHALMIC | Status: AC
  Administered 2012-03-05 (×3): 1 [drp] via OPHTHALMIC

## 2012-03-05 MED ORDER — LIDOCAINE 3.5 % OP GEL OPTIME - NO CHARGE
OPHTHALMIC | Status: DC | PRN
  Administered 2012-03-05: 2 [drp] via OPHTHALMIC

## 2012-03-05 MED ORDER — BSS IO SOLN
INTRAOCULAR | Status: DC | PRN
  Administered 2012-03-05: 15 mL via INTRAOCULAR

## 2012-03-05 SURGICAL SUPPLY — 32 items

## 2012-03-05 NOTE — Brief Op Note (Signed)
Pre-Op Dx: Cataract OS Post-Op Dx: Cataract OS Surgeon: Faatima Tench Anesthesia: Topical with MAC Surgery: Cataract Extraction with Intraocular lens Implant OS Implant: B&L enVista Specimen: None Complications: None 

## 2012-03-05 NOTE — Anesthesia Postprocedure Evaluation (Signed)
  Anesthesia Post-op Note  Patient: Hannah Estes  Procedure(s) Performed: Procedure(s) (LRB): CATARACT EXTRACTION PHACO AND INTRAOCULAR LENS PLACEMENT (IOC) (Left)  Patient Location:  Short Stay  Anesthesia Type: MAC  Level of Consciousness: awake  Airway and Oxygen Therapy: Patient Spontanous Breathing  Post-op Pain: none  Post-op Assessment: Post-op Vital signs reviewed, Patient's Cardiovascular Status Stable, Respiratory Function Stable, Patent Airway, No signs of Nausea or vomiting and Pain level controlled  Post-op Vital Signs: Reviewed and stable  Complications: No apparent anesthesia complications

## 2012-03-05 NOTE — Transfer of Care (Signed)
Immediate Anesthesia Transfer of Care Note  Patient: Hannah Estes  Procedure(s) Performed: Procedure(s) (LRB): CATARACT EXTRACTION PHACO AND INTRAOCULAR LENS PLACEMENT (IOC) (Left)  Patient Location: Shortstay  Anesthesia Type: MAC  Level of Consciousness: awake  Airway & Oxygen Therapy: Patient Spontanous Breathing   Post-op Assessment: Report given to PACU RN, Post -op Vital signs reviewed and stable and Patient moving all extremities  Post vital signs: Reviewed and stable  Complications: No apparent anesthesia complications

## 2012-03-05 NOTE — H&P (Signed)
I have reviewed the H&P, the patient was re-examined, and I have identified no interval changes in medical condition and plan of care since the history and physical of record  

## 2012-03-05 NOTE — Preoperative (Signed)
Beta Blockers   Reason not to administer Beta Blockers: on schedule last dose 03/05/2012 0500

## 2012-03-05 NOTE — Op Note (Signed)
NAMERENATA, GAMBINO               ACCOUNT NO.:  [id number]  MEDICAL RECORD NO.:  [id number]  LOCATION:  [facility]                          FACILITY:  [facility]  PHYSICIAN:  Susanne Greenhouse, MD       DATE OF BIRTH:  Apr 03, 1925  DATE OF PROCEDURE:  03/05/2012 DATE OF DISCHARGE:                              OPERATIVE REPORT   PREOPERATIVE DIAGNOSIS:  Combined cataract, left eye, diagnosis code 366.19.  POSTOPERATIVE DIAGNOSIS:  Combined cataract, left eye, diagnosis code 366.19.  OPERATION PERFORMED:  Phacoemulsification with posterior chamber intraocular lens implantation, left eye.  SURGEON:  Bonne Dolores. Yosef Krogh, MD  ANESTHESIA:  Topical with monitored anesthesia care and IV sedation.  OPERATIVE SUMMARY:  In the preoperative area, dilating drops were placed into the left eye.  The patient was then brought into the operating room where he was placed under general anesthesia.  The eye was then prepped and draped.  Beginning with a 75 blade, a paracentesis port was made at the surgeon's 2 o'clock position.  The anterior chamber was then filled with a 1% nonpreserved lidocaine solution with epinephrine.  This was followed by Viscoat to deepen the chamber.  A small fornix-based peritomy was performed superiorly.  Next, a single iris hook was placed through the limbus superiorly.  A 2.4-mm keratome blade was then used to make a clear corneal incision over the iris hook.  A bent cystotome needle and Utrata forceps were used to create a continuous tear capsulotomy.  Hydrodissection was performed using balanced salt solution on a fine cannula.  The lens nucleus was then removed using phacoemulsification in a quadrant cracking technique.  The cortical material was then removed with irrigation and aspiration.  The capsular bag and anterior chamber were refilled with Provisc.  The wound was widened to approximately 3 mm and a posterior chamber intraocular lens was placed into the capsular bag without  difficulty using an [employer] lens injecting system.  A single 10-0 nylon suture was then used to close the incision as well as stromal hydration.  The Provisc was removed from the anterior chamber and capsular bag with irrigation and aspiration.  At this point, the wounds were tested for leak, which were negative.  The anterior chamber remained deep and stable.  The patient tolerated the procedure well.  There were no operative complications, and he awoke from general anesthesia without problem.  No surgical specimens.  Prosthetic device used is a Theme park manager, model EnVista, model number MX60, power of 22.5, serial number is [id number].          ______________________________ Susanne Greenhouse, MD     KEH/MEDQ  D:  03/05/2012  T:  03/05/2012  Job:  [id number]

## 2012-03-05 NOTE — Anesthesia Preprocedure Evaluation (Addendum)
Anesthesia Evaluation  Patient identified by MRN, date of birth, ID band Patient awake    Reviewed: Allergy & Precautions, H&P , NPO status , Patient's Chart, lab work & pertinent test results, reviewed documented beta blocker date and time   Airway Mallampati: II      Dental  (+) Teeth Intact   Pulmonary COPDformer smoker,  breath sounds clear to auscultation        Cardiovascular + CAD, + Past MI, + CABG and + Peripheral Vascular Disease Rhythm:Regular Rate:Normal     Neuro/Psych    GI/Hepatic GERD-  ,  Endo/Other    Renal/GU      Musculoskeletal   Abdominal   Peds  Hematology   Anesthesia Other Findings   Reproductive/Obstetrics                           Anesthesia Physical Anesthesia Plan  ASA: III  Anesthesia Plan: MAC   Post-op Pain Management:    Induction: Intravenous  Airway Management Planned: Nasal Cannula  Additional Equipment:   Intra-op Plan:   Post-operative Plan:   Informed Consent: I have reviewed the patients History and Physical, chart, labs and discussed the procedure including the risks, benefits and alternatives for the proposed anesthesia with the patient or authorized representative who has indicated his/her understanding and acceptance.     Plan Discussed with:   Anesthesia Plan Comments:         Anesthesia Quick Evaluation  

## 2012-03-05 NOTE — Anesthesia Procedure Notes (Signed)
Procedure Name: MAC Date/Time: 03/05/2012 7:28 AM Performed by: Franco Nones Pre-anesthesia Checklist: Patient identified, Emergency Drugs available, Suction available, Timeout performed and Patient being monitored Patient Re-evaluated:Patient Re-evaluated prior to inductionOxygen Delivery Method: Nasal Cannula

## 2012-03-07 ENCOUNTER — Encounter (HOSPITAL_COMMUNITY): Payer: Self-pay | Admitting: Ophthalmology

## 2012-03-08 ENCOUNTER — Encounter (HOSPITAL_COMMUNITY): Payer: Self-pay | Admitting: Pharmacy Technician

## 2012-03-15 ENCOUNTER — Encounter (HOSPITAL_COMMUNITY): Payer: Self-pay

## 2012-03-15 ENCOUNTER — Encounter (HOSPITAL_COMMUNITY)
Admission: RE | Admit: 2012-03-15 | Discharge: 2012-03-15 | Payer: Medicare Other | Source: Ambulatory Visit | Admitting: Ophthalmology

## 2012-03-16 MED ORDER — FENTANYL CITRATE 0.05 MG/ML IJ SOLN: 25.0000 ug | INTRAMUSCULAR | Status: DC | PRN

## 2012-03-16 MED ORDER — NEOMYCIN-POLYMYXIN-DEXAMETH 3.5-10000-0.1 OP OINT
TOPICAL_OINTMENT | OPHTHALMIC | Status: AC
  Filled 2012-03-16: qty 3.5

## 2012-03-16 MED ORDER — CYCLOPENTOLATE-PHENYLEPHRINE 0.2-1 % OP SOLN
OPHTHALMIC | Status: AC
  Filled 2012-03-16: qty 2

## 2012-03-16 MED ORDER — TETRACAINE HCL 0.5 % OP SOLN
OPHTHALMIC | Status: AC
  Filled 2012-03-16: qty 2

## 2012-03-16 MED ORDER — PHENYLEPHRINE HCL 2.5 % OP SOLN
OPHTHALMIC | Status: AC
  Filled 2012-03-16: qty 2

## 2012-03-16 MED ORDER — LIDOCAINE HCL (PF) 1 % IJ SOLN
INTRAMUSCULAR | Status: AC
  Filled 2012-03-16: qty 2

## 2012-03-16 MED ORDER — LIDOCAINE HCL 3.5 % OP GEL
OPHTHALMIC | Status: AC
  Filled 2012-03-16: qty 5

## 2012-03-16 MED ORDER — ONDANSETRON HCL 4 MG/2ML IJ SOLN: 4.0000 mg | Freq: Once | INTRAMUSCULAR | Status: AC | PRN

## 2012-03-19 ENCOUNTER — Encounter (HOSPITAL_COMMUNITY): Payer: Self-pay | Admitting: Anesthesiology

## 2012-03-19 ENCOUNTER — Ambulatory Visit (HOSPITAL_COMMUNITY)
Admission: RE | Admit: 2012-03-19 | Discharge: 2012-03-19 | Disposition: A | Discharge status: Home / Self Care | Payer: Medicare Other | Source: Ambulatory Visit | Attending: Ophthalmology | Admitting: Ophthalmology

## 2012-03-19 ENCOUNTER — Ambulatory Visit (HOSPITAL_COMMUNITY): Payer: Medicare Other | Admitting: Anesthesiology

## 2012-03-19 ENCOUNTER — Encounter (HOSPITAL_COMMUNITY): Payer: Self-pay | Admitting: *Deleted

## 2012-03-19 ENCOUNTER — Encounter (HOSPITAL_COMMUNITY)
Admission: RE | Disposition: A | Discharge status: Home / Self Care | Payer: Self-pay | Source: Ambulatory Visit | Attending: Ophthalmology

## 2012-03-19 DIAGNOSIS — H2589 Other age-related cataract: Secondary | ICD-10-CM | POA: Insufficient documentation

## 2012-03-19 DIAGNOSIS — I2510 Atherosclerotic heart disease of native coronary artery without angina pectoris: Secondary | ICD-10-CM | POA: Insufficient documentation

## 2012-03-19 DIAGNOSIS — J449 Chronic obstructive pulmonary disease, unspecified: Secondary | ICD-10-CM | POA: Insufficient documentation

## 2012-03-19 DIAGNOSIS — J4489 Other specified chronic obstructive pulmonary disease: Secondary | ICD-10-CM | POA: Insufficient documentation

## 2012-03-19 HISTORY — PX: CATARACT EXTRACTION W/PHACO: SHX586

## 2012-03-19 SURGERY — PHACOEMULSIFICATION, CATARACT, WITH IOL INSERTION
Anesthesia: Monitor Anesthesia Care | Site: Eye | Laterality: Right | Wound class: Clean

## 2012-03-19 MED ORDER — POVIDONE-IODINE 5 % OP SOLN
OPHTHALMIC | Status: DC | PRN
  Administered 2012-03-19: 1 via OPHTHALMIC

## 2012-03-19 MED ORDER — EPINEPHRINE HCL 1 MG/ML IJ SOLN
INTRAOCULAR | Status: DC | PRN
  Administered 2012-03-19: 13:00:00

## 2012-03-19 MED ORDER — EPINEPHRINE HCL 1 MG/ML IJ SOLN
INTRAMUSCULAR | Status: AC
  Filled 2012-03-19: qty 1

## 2012-03-19 MED ORDER — CYCLOPENTOLATE-PHENYLEPHRINE 0.2-1 % OP SOLN
1.0000 [drp] | OPHTHALMIC | Status: AC
  Administered 2012-03-19 (×3): 1 [drp] via OPHTHALMIC

## 2012-03-19 MED ORDER — LIDOCAINE HCL (PF) 1 % IJ SOLN
INTRAOCULAR | Status: DC | PRN
  Administered 2012-03-19: 13:00:00 via OPHTHALMIC

## 2012-03-19 MED ORDER — NEOMYCIN-POLYMYXIN-DEXAMETH 0.1 % OP OINT
TOPICAL_OINTMENT | OPHTHALMIC | Status: DC | PRN
  Administered 2012-03-19: 1 via OPHTHALMIC

## 2012-03-19 MED ORDER — LACTATED RINGERS IV SOLN
INTRAVENOUS | Status: DC
  Administered 2012-03-19: 13:00:00 via INTRAVENOUS

## 2012-03-19 MED ORDER — LACTATED RINGERS IV SOLN
INTRAVENOUS | Status: DC | PRN
  Administered 2012-03-19: 13:00:00 via INTRAVENOUS

## 2012-03-19 MED ORDER — MIDAZOLAM HCL 2 MG/2ML IJ SOLN
1.0000 mg | INTRAMUSCULAR | Status: DC | PRN
  Administered 2012-03-19: 2 mg via INTRAVENOUS

## 2012-03-19 MED ORDER — PROVISC 10 MG/ML IO SOLN
INTRAOCULAR | Status: DC | PRN
  Administered 2012-03-19: 8.5 mg via INTRAOCULAR

## 2012-03-19 MED ORDER — PHENYLEPHRINE HCL 2.5 % OP SOLN
1.0000 [drp] | OPHTHALMIC | Status: AC
  Administered 2012-03-19 (×3): 1 [drp] via OPHTHALMIC

## 2012-03-19 MED ORDER — LIDOCAINE HCL 3.5 % OP GEL
1.0000 "application " | Freq: Once | OPHTHALMIC | Status: AC
  Administered 2012-03-19: 1 via OPHTHALMIC

## 2012-03-19 MED ORDER — MIDAZOLAM HCL 2 MG/2ML IJ SOLN
INTRAMUSCULAR | Status: AC
  Filled 2012-03-19: qty 2

## 2012-03-19 MED ORDER — BSS IO SOLN
INTRAOCULAR | Status: DC | PRN
  Administered 2012-03-19: 15 mL via INTRAOCULAR

## 2012-03-19 MED ORDER — TETRACAINE HCL 0.5 % OP SOLN
1.0000 [drp] | OPHTHALMIC | Status: AC
Start: 2012-03-19 — End: 2012-03-19
  Administered 2012-03-19 (×3): 1 [drp] via OPHTHALMIC

## 2012-03-19 SURGICAL SUPPLY — 32 items

## 2012-03-19 NOTE — Anesthesia Procedure Notes (Signed)
Procedure Name: MAC Performed by: ANDRAZA, AMY L Pre-anesthesia Checklist: Patient identified, Patient being monitored, Emergency Drugs available, Timeout performed and Suction available Oxygen Delivery Method: Nasal cannula     

## 2012-03-19 NOTE — Anesthesia Postprocedure Evaluation (Signed)
  Anesthesia Post-op Note  Patient: Hannah Estes  Procedure(s) Performed: Procedure(s) (LRB) with comments: CATARACT EXTRACTION PHACO AND INTRAOCULAR LENS PLACEMENT (IOC) (Right) - CDE:24.94  Patient Location:short stay  Anesthesia Type: MAC  Level of Consciousness: awake, alert , oriented and patient cooperative  Airway and Oxygen Therapy: Patient Spontanous Breathing room air  Post-op Pain: mild  Post-op Assessment: Post-op Vital signs reviewed, Patient's Cardiovascular Status Stable, Respiratory Function Stable, Patent Airway and No signs of Nausea or vomiting  Post-op Vital Signs: Reviewed and stable  Complications: No apparent anesthesia complications

## 2012-03-19 NOTE — H&P (Signed)
I have reviewed the H&P, the patient was re-examined, and I have identified no interval changes in medical condition and plan of care since the history and physical of record  

## 2012-03-19 NOTE — Brief Op Note (Signed)
Pre-Op Dx: Cataract OD Post-Op Dx: Cataract OD Surgeon: Juliett Eastburn Anesthesia: Topical with MAC Surgery: Cataract Extraction with Intraocular lens Implant OD Implant: B&L enVista Specimen: None Complications: None 

## 2012-03-19 NOTE — Anesthesia Preprocedure Evaluation (Signed)
Anesthesia Evaluation  Patient identified by MRN, date of birth, ID band Patient awake    Reviewed: Allergy & Precautions, H&P , NPO status , Patient's Chart, lab work & pertinent test results, reviewed documented beta blocker date and time   Airway Mallampati: II      Dental  (+) Teeth Intact   Pulmonary COPDformer smoker,  breath sounds clear to auscultation        Cardiovascular + CAD, + Past MI, + CABG and + Peripheral Vascular Disease Rhythm:Regular Rate:Normal     Neuro/Psych    GI/Hepatic GERD-  ,  Endo/Other    Renal/GU      Musculoskeletal   Abdominal   Peds  Hematology   Anesthesia Other Findings   Reproductive/Obstetrics                           Anesthesia Physical Anesthesia Plan  ASA: III  Anesthesia Plan: MAC   Post-op Pain Management:    Induction: Intravenous  Airway Management Planned: Nasal Cannula  Additional Equipment:   Intra-op Plan:   Post-operative Plan:   Informed Consent: I have reviewed the patients History and Physical, chart, labs and discussed the procedure including the risks, benefits and alternatives for the proposed anesthesia with the patient or authorized representative who has indicated his/her understanding and acceptance.     Plan Discussed with:   Anesthesia Plan Comments:         Anesthesia Quick Evaluation

## 2012-03-19 NOTE — Preoperative (Signed)
Beta Blockers   Reason not to administer Beta Blockers:Not Applicable 

## 2012-03-19 NOTE — Transfer of Care (Signed)
  Anesthesia Post-op Note  Patient: Hannah Estes  Procedure(s) Performed: Procedure(s) (LRB) with comments: CATARACT EXTRACTION PHACO AND INTRAOCULAR LENS PLACEMENT (IOC) (Right) - CDE:24.94  Patient Location: Short stay  Anesthesia Type: MAC  Level of Consciousness: awake, alert , oriented and patient cooperative  Airway and Oxygen Therapy: Patient Spontanous Breathing room air  Post-op Pain: mild  Post-op Assessment: Post-op Vital signs reviewed, Patient's Cardiovascular Status Stable, Respiratory Function Stable, Patent Airway and No signs of Nausea or vomiting  Post-op Vital Signs: Reviewed and stable  Complications: No apparent anesthesia complications

## 2012-03-20 NOTE — Op Note (Signed)
Hannah Estes, NOVITSKY               ACCOUNT NO.:  [id number]  MEDICAL RECORD NO.:  [id number]  LOCATION:  [facility]                          FACILITY:  [facility]  PHYSICIAN:  Susanne Greenhouse, MD       DATE OF BIRTH:  May 05, 1925  DATE OF PROCEDURE:  03/19/2012 DATE OF DISCHARGE:  03/19/2012                              OPERATIVE REPORT   PREOPERATIVE DIAGNOSIS:  Combined cataract, right eye, diagnosis code 366.19.  POSTOPERATIVE DIAGNOSIS:  Combined cataract, right eye, diagnosis code 366.19.  OPERATION PERFORMED:  Phacoemulsification with posterior chamber intraocular lens implantation, right eye.  ANESTHESIA:  Topical with monitored anesthesia care and IV sedation.  OPERATIVE SUMMARY:  In the preoperative area, dilating drops were placed into the right eye.  The patient was then brought into the operating room where she was placed under general anesthesia.  The eye was then prepped and draped.  Beginning with a 75 blade, a paracentesis port was made at the surgeon's 2 o'clock position.  The anterior chamber was then filled with a 1% nonpreserved lidocaine solution with epinephrine.  This was followed by Viscoat to deepen the chamber.  A small fornix-based peritomy was performed superiorly.  Next, a single iris hook was placed through the limbus superiorly.  A 2.4-mm keratome blade was then used to make a clear corneal incision over the iris hook.  A bent cystotome needle and Utrata forceps were used to create a continuous tear capsulotomy.  Hydrodissection was performed using balanced salt solution on a fine cannula.  The lens nucleus was then removed using phacoemulsification in a quadrant cracking technique.  The cortical material was then removed with irrigation and aspiration.  The capsular bag and anterior chamber were refilled with Provisc.  The wound was widened to approximately 3 mm and a posterior chamber intraocular lens was placed into the capsular bag without difficulty using an  [employer] lens injecting system.  A single 10-0 nylon suture was then used to close the incision as well as stromal hydration.  The Provisc was removed from the anterior chamber and capsular bag with irrigation and aspiration.  At this point, the wounds were tested for leak, which were negative.  The anterior chamber remained deep and stable.  The patient tolerated the procedure well.  There were no operative complications, and he awoke from general anesthesia without problem.  No surgical specimens.  Prosthetic device used Bausch and Lomb posterior chamber lens, model EnVista model number MX 60, power of 22.5, serial number is [id number].         ______________________________ Susanne Greenhouse, MD    KEH/MEDQ  D:  03/19/2012  T:  03/20/2012  Job:  [id number]

## 2012-03-23 ENCOUNTER — Encounter (HOSPITAL_COMMUNITY): Payer: Self-pay | Admitting: Ophthalmology

## 2012-07-04 ENCOUNTER — Encounter (INDEPENDENT_AMBULATORY_CARE_PROVIDER_SITE_OTHER): Payer: Medicare Other

## 2012-07-04 DIAGNOSIS — I6529 Occlusion and stenosis of unspecified carotid artery: Secondary | ICD-10-CM

## 2012-07-09 ENCOUNTER — Telehealth: Payer: Self-pay | Admitting: *Deleted

## 2012-07-09 NOTE — Telephone Encounter (Signed)
Patient notified

## 2012-07-09 NOTE — Telephone Encounter (Signed)
Message copied by Eustace Moore on Mon Jul 09, 2012 10:18 AM ------      Message from: MCDOWELL, Illene Bolus      Created: Mon Jul 09, 2012  8:04 AM       Reviewed. 40-59% bilateral ICA stenoses, progressed somewhat on the right compared with previous study. Continue medical therapy, anticipate followup carotid Doppler in 1 year. ------

## 2012-07-09 NOTE — Telephone Encounter (Signed)
Message copied by Lesle Chris on Mon Jul 09, 2012  5:00 PM ------      Message from: MCDOWELL, Illene Bolus      Created: Mon Jul 09, 2012  8:04 AM       Reviewed. 40-59% bilateral ICA stenoses, progressed somewhat on the right compared with previous study. Continue medical therapy, anticipate followup carotid Doppler in 1 year. ------

## 2012-07-23 ENCOUNTER — Other Ambulatory Visit: Payer: Self-pay | Admitting: Physician Assistant

## 2012-07-23 ENCOUNTER — Telehealth: Payer: Self-pay | Admitting: Cardiology

## 2012-07-23 MED ORDER — LISINOPRIL 5 MG PO TABS: ORAL_TABLET | ORAL | Status: DC

## 2012-07-23 MED ORDER — METOPROLOL SUCCINATE ER 25 MG PO TB24: ORAL_TABLET | ORAL | Status: DC

## 2012-07-23 NOTE — Telephone Encounter (Signed)
lisinopril (PRINIVIL,ZESTRIL) 5 MG tablet   metrprolox 25 mg  Patient states she is out of both medicines  [facility] in [place]

## 2013-02-07 ENCOUNTER — Encounter: Payer: Self-pay | Admitting: Cardiology

## 2013-02-07 ENCOUNTER — Ambulatory Visit (INDEPENDENT_AMBULATORY_CARE_PROVIDER_SITE_OTHER): Payer: Medicare Other | Admitting: Cardiology

## 2013-02-07 VITALS — BP 115/70 | HR 65 | Ht 65.0 in

## 2013-02-07 DIAGNOSIS — I6529 Occlusion and stenosis of unspecified carotid artery: Secondary | ICD-10-CM

## 2013-02-07 DIAGNOSIS — I2510 Atherosclerotic heart disease of native coronary artery without angina pectoris: Secondary | ICD-10-CM

## 2013-02-07 NOTE — Assessment & Plan Note (Signed)
Followup carotid Dopplers in April 2015.

## 2013-02-07 NOTE — Progress Notes (Signed)
Clinical Summary Hannah Estes is an 79 y.o.female last seen in November 2013. She continues to do very well, no angina symptoms or significant shortness of breath. She dances 5 nights a week, ballroom and other steps, feels that she has good energy on the dance floor and no major limitation.  Carotid Dopplers in April demonstrated 40-59% bilateral ICA stenoses. We reviewed this. She continues on aspirin, omega-3 supplements, has not been on statins long-term.  ECG today shows sinus rhythm with nonspecific ST changes, decreased R wave progression.   Allergies  Allergen Reactions  . Levaquin [Levofloxacin In D5w] Hives  . Penicillins Hives    Current Outpatient Prescriptions  Medication Sig Dispense Refill  . albuterol (PROVENTIL HFA;VENTOLIN HFA) 108 (90 BASE) MCG/ACT inhaler Inhale 2 puffs into the lungs every 6 (six) hours as needed for wheezing or shortness of breath.      Marland Kitchen aspirin 81 MG tablet Take 81 mg by mouth daily.        . budesonide-formoterol (SYMBICORT) 80-4.5 MCG/ACT inhaler Inhale 1 puff into the lungs 2 (two) times daily.       . Garlic 500 MG CAPS Take 1 capsule by mouth daily.       Marland Kitchen lisinopril (PRINIVIL,ZESTRIL) 5 MG tablet TAKE 1 TABLET ONCE DAILY.  30 tablet  6  . metoprolol succinate (TOPROL XL) 25 MG 24 hr tablet TAKE 1 TABLET ONCE DAILY.  30 tablet  6  . Multiple Vitamins-Minerals (MULTIVITAMIN WITH MINERALS) tablet Take 1 tablet by mouth every other day.       . Multiple Vitamins-Minerals (STRESS B COMPLEX/ZINC PO) Take 1 tablet by mouth daily. OCUVITE      . nitroGLYCERIN (NITROSTAT) 0.4 MG SL tablet Place 0.4 mg under the tongue every 5 (five) minutes as needed. Chest pain.      . Omega-3 Fatty Acids (FISH OIL) 1200 MG CAPS Take 2 capsules by mouth 2 (two) times daily. OTC medication, 1200 mg capsule, only delivers 900 mg of fish oil, called a mini capsule       No current facility-administered medications for this visit.   Facility-Administered  Medications Ordered in Other Visits  Medication Dose Route Frequency Provider Last Rate Last Dose  . neomycin-polymyxin-dexameth (MAXITROL) 0.1 % ophth ointment    PRN Gemma Payor, MD   1 application at 03/05/12 1610    Past Medical History  Diagnosis Date  . COPD (chronic obstructive pulmonary disease)   . Coronary artery disease     Multivessel, LVEF 65%  . Hyperlipidemia   . GERD (gastroesophageal reflux disease)   . Myocardial infarction     NSTEMI 6/09  . Carotid artery disease     Past Surgical History  Procedure Laterality Date  . Coronary artery bypass graft  2009    Off pump LIMA to LAD, SVG to first obtuse marginal , SVG to PDA    . Cataract extraction w/phaco  03/05/2012    Procedure: CATARACT EXTRACTION PHACO AND INTRAOCULAR LENS PLACEMENT (IOC);  Surgeon: Gemma Payor, MD;  Location: [facility];  Service: Ophthalmology;  Laterality: Left;  CDE 25.71  . Cataract extraction w/phaco  03/19/2012    Procedure: CATARACT EXTRACTION PHACO AND INTRAOCULAR LENS PLACEMENT (IOC);  Surgeon: Gemma Payor, MD;  Location: [facility];  Service: Ophthalmology;  Laterality: Right;  CDE:24.94    Social History Ms. Palomarez reports that she quit smoking about 15 years ago. Her smoking use included Cigarettes. She has a 16 pack-year smoking history. She has never  used smokeless tobacco. Ms. Kulesza reports that she does not drink alcohol.  Review of Systems No palpitations, orthopnea or PND, no leg edema. No claudication. No speech deficits or focal motor weakness. Uses her inhalers only occasionally. Otherwise negative.  Physical Examination Filed Vitals:   02/07/13 1051  BP: 115/70  Pulse: 65   Comfortable at rest. HEENT: Conjunctiva and lids normal, oropharynx with moist mucosa.  Neck: Supple, no elevated JVP, soft left bruit..  Lungs: Diminished breath sounds, slight end expiratory rhonchi. No active wheezing.  Cardiac: Regular rate and rhythm, no S3.  Thorax: Chest wall stable status post  sternotomy.  Skin: Warm and dry.  Musculoskeletal: No gross deformities.  Extremities: No pitting edema, distal pulses one plus.    Problem List and Plan   CORONARY ATHEROSCLEROSIS NATIVE CORONARY ARTERY Clinically stable with multivessel disease status post prior CABG as outlined. ECG reviewed. Continue medical therapy and observation, encouraged her to continue her regular dancing. Followup arranged.  CAROTID ARTERY DISEASE Followup carotid Dopplers in April 2015.    Jonelle Sidle, M.D., F.A.C.C.

## 2013-02-07 NOTE — Patient Instructions (Addendum)
Continue all current medications. Carotid doppler - due April 2015 (should receive recall in mail when time) Your physician wants you to follow up in:  1 year.  You will receive a reminder letter in the mail one-two months in advance.  If you don't receive a letter, please call our office to schedule the follow up appointment

## 2013-02-07 NOTE — Assessment & Plan Note (Signed)
Clinically stable with multivessel disease status post prior CABG as outlined. ECG reviewed. Continue medical therapy and observation, encouraged her to continue her regular dancing. Followup arranged.

## 2013-05-06 ENCOUNTER — Other Ambulatory Visit: Payer: Self-pay | Admitting: *Deleted

## 2013-05-06 DIAGNOSIS — I6529 Occlusion and stenosis of unspecified carotid artery: Secondary | ICD-10-CM

## 2013-05-07 DIAGNOSIS — S335XXA Sprain of ligaments of lumbar spine, initial encounter: Secondary | ICD-10-CM | POA: Diagnosis not present

## 2013-05-07 DIAGNOSIS — M999 Biomechanical lesion, unspecified: Secondary | ICD-10-CM | POA: Diagnosis not present

## 2013-05-09 DIAGNOSIS — M999 Biomechanical lesion, unspecified: Secondary | ICD-10-CM | POA: Diagnosis not present

## 2013-05-09 DIAGNOSIS — S335XXA Sprain of ligaments of lumbar spine, initial encounter: Secondary | ICD-10-CM | POA: Diagnosis not present

## 2013-06-11 DIAGNOSIS — M999 Biomechanical lesion, unspecified: Secondary | ICD-10-CM | POA: Diagnosis not present

## 2013-06-11 DIAGNOSIS — S335XXA Sprain of ligaments of lumbar spine, initial encounter: Secondary | ICD-10-CM | POA: Diagnosis not present

## 2013-06-18 DIAGNOSIS — M999 Biomechanical lesion, unspecified: Secondary | ICD-10-CM | POA: Diagnosis not present

## 2013-06-18 DIAGNOSIS — S335XXA Sprain of ligaments of lumbar spine, initial encounter: Secondary | ICD-10-CM | POA: Diagnosis not present

## 2013-07-03 ENCOUNTER — Encounter (INDEPENDENT_AMBULATORY_CARE_PROVIDER_SITE_OTHER): Payer: Medicare Other

## 2013-07-03 DIAGNOSIS — I6529 Occlusion and stenosis of unspecified carotid artery: Secondary | ICD-10-CM | POA: Diagnosis not present

## 2013-07-08 ENCOUNTER — Telehealth: Payer: Self-pay | Admitting: *Deleted

## 2013-07-08 NOTE — Telephone Encounter (Signed)
Patient informed. 

## 2013-07-08 NOTE — Telephone Encounter (Signed)
Message copied by Eustace MooreANDERSON, Konrad Hoak M on Mon Jul 08, 2013 10:40 AM ------      Message from: MCDOWELL, Illene BolusSAMUEL G      Created: Thu Jul 04, 2013 10:13 AM       Reviewed report. Stable 40-59% bilateral ICA stenoses. For followup carotid Doppler in 1 year. ------

## 2013-07-15 DIAGNOSIS — H43819 Vitreous degeneration, unspecified eye: Secondary | ICD-10-CM | POA: Diagnosis not present

## 2014-01-29 ENCOUNTER — Encounter: Payer: Self-pay | Admitting: *Deleted

## 2014-01-29 ENCOUNTER — Ambulatory Visit (INDEPENDENT_AMBULATORY_CARE_PROVIDER_SITE_OTHER): Payer: Medicare Other | Admitting: Cardiology

## 2014-01-29 ENCOUNTER — Encounter: Payer: Self-pay | Admitting: Cardiology

## 2014-01-29 VITALS — BP 117/71 | HR 69 | Ht 65.0 in | Wt 176.0 lb

## 2014-01-29 DIAGNOSIS — E782 Mixed hyperlipidemia: Secondary | ICD-10-CM | POA: Diagnosis not present

## 2014-01-29 DIAGNOSIS — I2510 Atherosclerotic heart disease of native coronary artery without angina pectoris: Secondary | ICD-10-CM

## 2014-01-29 DIAGNOSIS — I6523 Occlusion and stenosis of bilateral carotid arteries: Secondary | ICD-10-CM

## 2014-01-29 DIAGNOSIS — I6529 Occlusion and stenosis of unspecified carotid artery: Secondary | ICD-10-CM

## 2014-01-29 NOTE — Progress Notes (Signed)
Reason for visit: CAD, hyperlipidemia  Clinical Summary Hannah Estes is an 80 y.o.female last seen in November 2014. She reports no angina symptoms or nitroglycerin use. Still dances 5 nights a week, seems to enjoy this quite a bit. She occasionally has to use an MDI. She continues on aspirin, beta blocker, ACE inhibitor, and omega-3 supplements. Bypass surgery was in 2009. We discussed a follow-up stress test for ischemic surveillance. ECG today shows sinus rhythm with decreased R wave progression and nonspecific ST changes.  Follow-up carotid Dopplers from April of this year showed stable 40-59% bilateral ICA stenoses.  She continues to follow with Dr. Loney Estes. No longer on statin therapy. She does take an over-the-counter cholesterol reducing agent.  Allergies  Allergen Reactions  . Levaquin [Levofloxacin In D5w] Hives  . Penicillins Hives    Current Outpatient Prescriptions  Medication Sig Dispense Refill  . albuterol (PROVENTIL HFA;VENTOLIN HFA) 108 (90 BASE) MCG/ACT inhaler Inhale 2 puffs into the lungs every 6 (six) hours as needed for wheezing or shortness of breath.    Marland Kitchen. aspirin 81 MG tablet Take 81 mg by mouth daily.      . budesonide-formoterol (SYMBICORT) 80-4.5 MCG/ACT inhaler Inhale 1 puff into the lungs 2 (two) times daily as needed.     . Garlic 500 MG CAPS Take 1 capsule by mouth daily.     Marland Kitchen. lisinopril (PRINIVIL,ZESTRIL) 5 MG tablet TAKE 1 TABLET ONCE DAILY. 30 tablet 6  . metoprolol succinate (TOPROL XL) 25 MG 24 hr tablet TAKE 1 TABLET ONCE DAILY. 30 tablet 6  . Multiple Vitamins-Minerals (MULTIVITAMIN WITH MINERALS) tablet Take 1 tablet by mouth every other day.     . Multiple Vitamins-Minerals (STRESS B COMPLEX/ZINC PO) Take 1 tablet by mouth daily. OCUVITE    . nitroGLYCERIN (NITROSTAT) 0.4 MG SL tablet Place 0.4 mg under the tongue every 5 (five) minutes as needed. Chest pain.    . Omega-3 Fatty Acids (FISH OIL) 1200 MG CAPS Take 2 capsules by mouth 2 (two) times  daily. OTC medication, 1200 mg capsule, only delivers 900 mg of fish oil, called a mini capsule     No current facility-administered medications for this visit.   Facility-Administered Medications Ordered in Other Visits  Medication Dose Route Frequency Provider Last Rate Last Dose  . neomycin-polymyxin-dexameth (MAXITROL) 0.1 % ophth ointment    PRN Gemma PayorKerry Hunt, MD   1 application at 03/05/12 [id number]    Past Medical History  Diagnosis Date  . COPD (chronic obstructive pulmonary disease)   . Coronary artery disease     Multivessel, LVEF 65%  . Hyperlipidemia   . GERD (gastroesophageal reflux disease)   . Myocardial infarction     NSTEMI 6/09  . Carotid artery disease     Past Surgical History  Procedure Laterality Date  . Coronary artery bypass graft  2009    Off pump LIMA to LAD, SVG to first obtuse marginal , SVG to PDA    . Cataract extraction w/phaco  03/05/2012    Procedure: CATARACT EXTRACTION PHACO AND INTRAOCULAR LENS PLACEMENT (IOC);  Surgeon: Gemma PayorKerry Hunt, MD;  Location: [facility];  Service: Ophthalmology;  Laterality: Left;  CDE 25.71  . Cataract extraction w/phaco  03/19/2012    Procedure: CATARACT EXTRACTION PHACO AND INTRAOCULAR LENS PLACEMENT (IOC);  Surgeon: Gemma PayorKerry Hunt, MD;  Location: [facility];  Service: Ophthalmology;  Laterality: Right;  CDE:24.94    Social History Hannah Estes reports that she quit smoking about 16 years ago. Her smoking use  included Cigarettes. She has a 16 pack-year smoking history. She has never used smokeless tobacco. Hannah Estes reports that she does not drink alcohol.  Review of Systems Complete review of systems negative except as otherwise outlined in the clinical summary and also the following.no claudication. No palpitations or syncope.  Physical Examination Filed Vitals:   01/29/14 1118  BP: 117/71  Pulse: 69   Filed Weights   01/29/14 1118  Weight: 176 lb (79.833 kg)    Comfortable at rest. HEENT: Conjunctiva and lids normal,  oropharynx with moist mucosa.  Neck: Supple, no elevated JVP, soft left bruit..  Lungs: Diminished breath sounds, slight end expiratory rhonchi. No active wheezing.  Cardiac: Regular rate and rhythm, no S3.  Thorax: Chest wall stable status post sternotomy.  Skin: Warm and dry.  Musculoskeletal: No gross deformities.  Extremities: No pitting edema, distal pulses one plus.    Problem List and Plan   CORONARY ATHEROSCLEROSIS NATIVE CORONARY ARTERY She has done very well, symptomatically stable on medical therapy. She continues to dance 5 nights a week, reports compliance with her medications, no nitroglycerin use. It has been 6 years since her bypass surgery, and we will go ahead and follow-up with an exercise Cardiolite to reassess ischemic burden.  Carotid artery occlusion Follow-up carotid Dopplers from April of this year showed 40-59% bilateral ICA stenoses.  Mixed hyperlipidemia She continue his omega-3 supplements and over-the-counter cholesterol reducing agent. Keep follow-up with Dr. Loney Estes.    Jonelle SidleSamuel G. Thetis Schwimmer, M.D., F.A.C.C.

## 2014-01-29 NOTE — Assessment & Plan Note (Signed)
She continue his omega-3 supplements and over-the-counter cholesterol reducing agent. Keep follow-up with Dr. Loney HeringBluth.

## 2014-01-29 NOTE — Assessment & Plan Note (Signed)
She has done very well, symptomatically stable on medical therapy. She continues to dance 5 nights a week, reports compliance with her medications, no nitroglycerin use. It has been 6 years since her bypass surgery, and we will go ahead and follow-up with an exercise Cardiolite to reassess ischemic burden.

## 2014-01-29 NOTE — Patient Instructions (Signed)
Your physician recommends that you schedule a follow-up appointment in: 1 year. You will receive a reminder letter in the mail in about 10 months reminding you to call and schedule your appointment. If you don't receive this letter, please contact our office. Your physician recommends that you continue on your current medications as directed. Please refer to the Current Medication list given to you today. Your physician has requested that you have en exercise stress myoview. For further information please visit [email or web address]. Please follow instruction sheet, as given.  

## 2014-01-29 NOTE — Assessment & Plan Note (Signed)
Follow-up carotid Dopplers from April of this year showed 40-59% bilateral ICA stenoses.

## 2014-01-31 ENCOUNTER — Telehealth: Payer: Self-pay | Admitting: Cardiology

## 2014-01-31 NOTE — Telephone Encounter (Signed)
Patient caDoesn't feel she needs to the test. Stated that she dances everyday and feels fine.  Also stated that she is a diabetic and can not go that long with out eating.

## 2014-02-04 NOTE — Telephone Encounter (Signed)
Patient said she does want the test but wants to wait to have it done when her son is in town.

## 2014-02-12 ENCOUNTER — Encounter (HOSPITAL_COMMUNITY): Payer: Medicare Other

## 2014-02-12 ENCOUNTER — Ambulatory Visit (HOSPITAL_COMMUNITY): Payer: Medicare Other

## 2014-03-05 DIAGNOSIS — M47816 Spondylosis without myelopathy or radiculopathy, lumbar region: Secondary | ICD-10-CM | POA: Diagnosis not present

## 2014-03-05 DIAGNOSIS — M9901 Segmental and somatic dysfunction of cervical region: Secondary | ICD-10-CM | POA: Diagnosis not present

## 2014-03-05 DIAGNOSIS — M47812 Spondylosis without myelopathy or radiculopathy, cervical region: Secondary | ICD-10-CM | POA: Diagnosis not present

## 2014-03-05 DIAGNOSIS — M9902 Segmental and somatic dysfunction of thoracic region: Secondary | ICD-10-CM | POA: Diagnosis not present

## 2014-03-05 DIAGNOSIS — M546 Pain in thoracic spine: Secondary | ICD-10-CM | POA: Diagnosis not present

## 2014-03-05 DIAGNOSIS — M9903 Segmental and somatic dysfunction of lumbar region: Secondary | ICD-10-CM | POA: Diagnosis not present

## 2014-03-07 DIAGNOSIS — M546 Pain in thoracic spine: Secondary | ICD-10-CM | POA: Diagnosis not present

## 2014-03-07 DIAGNOSIS — M9902 Segmental and somatic dysfunction of thoracic region: Secondary | ICD-10-CM | POA: Diagnosis not present

## 2014-03-07 DIAGNOSIS — M47812 Spondylosis without myelopathy or radiculopathy, cervical region: Secondary | ICD-10-CM | POA: Diagnosis not present

## 2014-03-07 DIAGNOSIS — M9903 Segmental and somatic dysfunction of lumbar region: Secondary | ICD-10-CM | POA: Diagnosis not present

## 2014-03-07 DIAGNOSIS — M47816 Spondylosis without myelopathy or radiculopathy, lumbar region: Secondary | ICD-10-CM | POA: Diagnosis not present

## 2014-03-07 DIAGNOSIS — M9901 Segmental and somatic dysfunction of cervical region: Secondary | ICD-10-CM | POA: Diagnosis not present

## 2014-03-10 DIAGNOSIS — M546 Pain in thoracic spine: Secondary | ICD-10-CM | POA: Diagnosis not present

## 2014-03-10 DIAGNOSIS — M9901 Segmental and somatic dysfunction of cervical region: Secondary | ICD-10-CM | POA: Diagnosis not present

## 2014-03-10 DIAGNOSIS — M47812 Spondylosis without myelopathy or radiculopathy, cervical region: Secondary | ICD-10-CM | POA: Diagnosis not present

## 2014-03-10 DIAGNOSIS — M9903 Segmental and somatic dysfunction of lumbar region: Secondary | ICD-10-CM | POA: Diagnosis not present

## 2014-03-10 DIAGNOSIS — M9902 Segmental and somatic dysfunction of thoracic region: Secondary | ICD-10-CM | POA: Diagnosis not present

## 2014-03-10 DIAGNOSIS — M47816 Spondylosis without myelopathy or radiculopathy, lumbar region: Secondary | ICD-10-CM | POA: Diagnosis not present

## 2014-05-06 ENCOUNTER — Other Ambulatory Visit: Payer: Self-pay | Admitting: Radiology

## 2014-05-06 DIAGNOSIS — I6523 Occlusion and stenosis of bilateral carotid arteries: Secondary | ICD-10-CM

## 2014-07-02 ENCOUNTER — Encounter (INDEPENDENT_AMBULATORY_CARE_PROVIDER_SITE_OTHER): Payer: Medicare Other

## 2014-07-02 DIAGNOSIS — I6523 Occlusion and stenosis of bilateral carotid arteries: Secondary | ICD-10-CM | POA: Diagnosis not present

## 2014-07-08 ENCOUNTER — Telehealth: Payer: Self-pay | Admitting: *Deleted

## 2014-07-08 DIAGNOSIS — M47816 Spondylosis without myelopathy or radiculopathy, lumbar region: Secondary | ICD-10-CM | POA: Diagnosis not present

## 2014-07-08 DIAGNOSIS — M9901 Segmental and somatic dysfunction of cervical region: Secondary | ICD-10-CM | POA: Diagnosis not present

## 2014-07-08 DIAGNOSIS — M546 Pain in thoracic spine: Secondary | ICD-10-CM | POA: Diagnosis not present

## 2014-07-08 DIAGNOSIS — M47812 Spondylosis without myelopathy or radiculopathy, cervical region: Secondary | ICD-10-CM | POA: Diagnosis not present

## 2014-07-08 DIAGNOSIS — I2510 Atherosclerotic heart disease of native coronary artery without angina pectoris: Secondary | ICD-10-CM

## 2014-07-08 DIAGNOSIS — M9903 Segmental and somatic dysfunction of lumbar region: Secondary | ICD-10-CM | POA: Diagnosis not present

## 2014-07-08 DIAGNOSIS — M9902 Segmental and somatic dysfunction of thoracic region: Secondary | ICD-10-CM | POA: Diagnosis not present

## 2014-07-08 NOTE — Telephone Encounter (Signed)
-----   Message from Samuel G McDowell, MD sent at 07/03/2014  7:37 AM EDT ----- Reviewed report. Stable 40-59% ICA stenosis, bilaterally. Continue medical therapy and observation. 

## 2014-07-08 NOTE — Telephone Encounter (Signed)
-----   Message from Jonelle SidleSamuel G McDowell, MD sent at 07/03/2014  7:37 AM EDT ----- Reviewed report. Stable 40-59% ICA stenosis, bilaterally. Continue medical therapy and observation.

## 2014-07-08 NOTE — Telephone Encounter (Signed)
Patient informed. 

## 2014-07-08 NOTE — Telephone Encounter (Signed)
Patient informed nurse that she has decided that she wasn't going to have the stress test done that was ordered at her last office visit. Patient advised that without the stress test, the doctor wouldn't be able to determine the condition of her heart during stress. Patient advised nurse that she is not having any problems anymore because and she no longer has the stressors in her life. Patient advised that the test was to assess her ischemic burden since she had bypass a while ago. Patient said she is totally refusing to have this test done at this time and she would discuss this with her doctor at her next visit.

## 2015-01-27 ENCOUNTER — Encounter: Payer: Self-pay | Admitting: Cardiology

## 2015-01-27 ENCOUNTER — Ambulatory Visit (INDEPENDENT_AMBULATORY_CARE_PROVIDER_SITE_OTHER): Payer: Medicare Other | Admitting: Cardiology

## 2015-01-27 VITALS — BP 144/75 | HR 70 | Ht 65.0 in | Wt 182.4 lb

## 2015-01-27 DIAGNOSIS — E782 Mixed hyperlipidemia: Secondary | ICD-10-CM

## 2015-01-27 DIAGNOSIS — I2510 Atherosclerotic heart disease of native coronary artery without angina pectoris: Secondary | ICD-10-CM | POA: Diagnosis not present

## 2015-01-27 DIAGNOSIS — I6523 Occlusion and stenosis of bilateral carotid arteries: Secondary | ICD-10-CM | POA: Diagnosis not present

## 2015-01-27 NOTE — Progress Notes (Signed)
Cardiology Office Note  Date: 01/27/2015   ID: Hannah Estes, DOB 1926/02/17, MRN [id number]  PCP: Ernestine Conrad, MD  Primary Cardiologist: Nona Dell, MD   Chief Complaint  Patient presents with  . Coronary Artery Disease    History of Present Illness: Hannah Estes is an 81 y.o. female last seen in November 2015. She presents for a routine follow-up visit. She tells me that she has been doing very well, still dances at least 5 times a week, does not report any significant angina or breathlessness beyond NYHA class II.  We have discussed follow-up ischemic testing to reassess ischemic burden with prior history of CABG in 2009. She has declined further testing unless symptoms intervene.  Follow-up ECG today shows sinus arrhythmia with old septal infarct pattern.  We reviewed her medications which are outlined below.  She continues to see Dr. Loney Hering for primary care and follow-up of her lipids.  Past Medical History  Diagnosis Date  . COPD (chronic obstructive pulmonary disease) (HCC)   . Coronary artery disease     Multivessel, LVEF 65%  . Hyperlipidemia   . GERD (gastroesophageal reflux disease)   . Myocardial infarction (HCC)     NSTEMI 6/09  . Carotid artery disease [facility])     Current Outpatient Prescriptions  Medication Sig Dispense Refill  . albuterol (PROVENTIL HFA;VENTOLIN HFA) 108 (90 BASE) MCG/ACT inhaler Inhale 2 puffs into the lungs every 6 (six) hours as needed for wheezing or shortness of breath.    Marland Kitchen aspirin 81 MG tablet Take 81 mg by mouth every other day.     . budesonide-formoterol (SYMBICORT) 80-4.5 MCG/ACT inhaler Inhale 1 puff into the lungs 2 (two) times daily as needed.     . Garlic 500 MG CAPS Take 1 capsule by mouth daily.     Marland Kitchen lisinopril (PRINIVIL,ZESTRIL) 5 MG tablet TAKE 1 TABLET ONCE DAILY. 30 tablet 6  . metoprolol succinate (TOPROL XL) 25 MG 24 hr tablet TAKE 1 TABLET ONCE DAILY. 30 tablet 6  . Multiple Vitamins-Minerals (MULTIVITAMIN  WITH MINERALS) tablet Take 1 tablet by mouth every other day.     . Multiple Vitamins-Minerals (STRESS B COMPLEX/ZINC PO) Take 1 tablet by mouth daily. OCUVITE    . nitroGLYCERIN (NITROSTAT) 0.4 MG SL tablet Place 0.4 mg under the tongue every 5 (five) minutes as needed. Chest pain.    . Omega-3 Fatty Acids (FISH OIL) 1200 MG CAPS Take 2 capsules by mouth 2 (two) times daily. OTC medication, 1200 mg capsule, only delivers 900 mg of fish oil, called a mini capsule     No current facility-administered medications for this visit.   Facility-Administered Medications Ordered in Other Visits  Medication Dose Route Frequency Provider Last Rate Last Dose  . neomycin-polymyxin-dexameth (MAXITROL) 0.1 % ophth ointment    PRN Gemma Payor, MD   1 application at 03/05/12 1914    Allergies:  Levaquin and Penicillins   Social History: The patient  reports that she quit smoking about 17 years ago. Her smoking use included Cigarettes. She has a 16 pack-year smoking history. She has never used smokeless tobacco. She reports that she does not drink alcohol or use illicit drugs.   ROS:  Please see the history of present illness. Otherwise, complete review of systems is positive for none.  All other systems are reviewed and negative.   Physical Exam: VS:  BP 144/75 mmHg  Pulse 70  Ht  (1.651 m)  Wt 182 lb 6.4  oz (82.736 kg)  BMI 30.35 kg/m2  SpO2 93%, BMI Body mass index is 30.35 kg/(m^2).  Wt Readings from Last 3 Encounters:  01/27/15 182 lb 6.4 oz (82.736 kg)  01/29/14 176 lb (79.833 kg)  02/27/12 168 lb 3.2 oz (76.295 kg)     Comfortable at rest. HEENT: Conjunctiva and lids normal, oropharynx with moist mucosa.  Neck: Supple, no elevated JVP, soft left bruit..  Lungs: Diminished breath sounds, slight end expiratory rhonchi. No active wheezing.  Cardiac: Regular rate and rhythm, no S3.  Thorax: Chest wall stable status post sternotomy.  Skin: Warm and dry.  Musculoskeletal: No gross  deformities.  Extremities: No pitting edema, distal pulses one plus.    ECG: ECG is ordered today.  Other Studies Reviewed Today:  Carotid Dopplers 07/02/2014 reported 40-59% bilateral ICA stenoses, stable.  Assessment and Plan:  1. Symptomatically stable CAD status post CABG in 2009. She continues to do very well and prefers a conservative approach at this point. We did discuss warning signs and symptoms. She will let us know if symptoms intervene.  2. Hyperlipidemia, she continues on omega-3 supplements, has had statin intolerance. Keep follow-up with Dr. Loney HeringBluth.  3. Carotid artery disease, for follow-up Dopplers next year.  Current medicines were reviewed with the patient today.   Orders Placed This Encounter  Procedures  . EKG 12-Lead    Disposition: FU with me in 1 year.   Signed, Jonelle SidleSamuel G. McDowell, MD, [facility] 01/27/2015 11:59 AM    [facility] [address], [place], [place] [address] Phone: [phone](336) [phone]; Fax: [phone]) [phone]

## 2015-01-27 NOTE — Patient Instructions (Signed)
Your physician recommends that you continue on your current medications as directed. Please refer to the Current Medication list given to you today. Your physician has requested that you have a carotid duplex in April 2017. This test is an ultrasound of the carotid arteries in your neck. It looks at blood flow through these arteries that supply the brain with blood. Allow one hour for this exam. There are no restrictions or special instructions. Your physician recommends that you schedule a follow-up appointment in: 1 year. You will receive a reminder letter in the mail in about 10 months reminding you to call and schedule your appointment. If you don't receive this letter, please contact our office.

## 2015-02-05 ENCOUNTER — Telehealth: Payer: Self-pay | Admitting: Cardiology

## 2015-02-05 NOTE — Telephone Encounter (Signed)
Pt says she fell a small distance from ladder and only scrapped her arm denies hitting head. Elevated BP lasted 2 hours with some dizziness and some SOB but pt thinks this is related to pt being scared over elevated BP. Pt denied CP/dizzness/SOB this morning. Pt will take metoprolol and lisinopril this evening with supper and call us prior if BP goes up again or symptoms return

## 2015-02-05 NOTE — Telephone Encounter (Signed)
Mrs. Hannah Estes calls stating that her BP is elevated. States that last evening at 10pm her BP was 199/110. States that she was dizzy last evening. Today her BP 140/78 at 8AM. 122/78 @ 8:15AM. States that she fell off of a ladder on Sunday.

## 2015-07-09 ENCOUNTER — Ambulatory Visit: Payer: Medicare Other

## 2015-07-09 DIAGNOSIS — I6523 Occlusion and stenosis of bilateral carotid arteries: Secondary | ICD-10-CM | POA: Diagnosis not present

## 2015-07-14 ENCOUNTER — Telehealth: Payer: Self-pay | Admitting: *Deleted

## 2015-07-14 NOTE — Telephone Encounter (Signed)
Patient informed. 

## 2015-07-14 NOTE — Telephone Encounter (Signed)
-----   Message from Jonelle SidleSamuel G McDowell, MD sent at 07/10/2015  6:41 PM EDT ----- Reviewed. Stable 1-39% ICA stenosis, bilaterally. Continue medical therapy and observation.

## 2016-01-26 DIAGNOSIS — Z124 Encounter for screening for malignant neoplasm of cervix: Secondary | ICD-10-CM | POA: Diagnosis not present

## 2016-01-26 DIAGNOSIS — Z6826 Body mass index (BMI) 26.0-26.9, adult: Secondary | ICD-10-CM | POA: Diagnosis not present

## 2016-01-26 DIAGNOSIS — I10 Essential (primary) hypertension: Secondary | ICD-10-CM | POA: Diagnosis not present

## 2016-01-26 DIAGNOSIS — Z0000 Encounter for general adult medical examination without abnormal findings: Secondary | ICD-10-CM | POA: Diagnosis not present

## 2016-01-26 DIAGNOSIS — Z1231 Encounter for screening mammogram for malignant neoplasm of breast: Secondary | ICD-10-CM | POA: Diagnosis not present

## 2016-01-26 DIAGNOSIS — E559 Vitamin D deficiency, unspecified: Secondary | ICD-10-CM | POA: Diagnosis not present

## 2016-01-26 DIAGNOSIS — Z1211 Encounter for screening for malignant neoplasm of colon: Secondary | ICD-10-CM | POA: Diagnosis not present

## 2016-01-26 DIAGNOSIS — E7801 Familial hypercholesterolemia: Secondary | ICD-10-CM | POA: Diagnosis not present

## 2016-03-08 DIAGNOSIS — Z951 Presence of aortocoronary bypass graft: Secondary | ICD-10-CM | POA: Diagnosis not present

## 2016-03-08 DIAGNOSIS — R404 Transient alteration of awareness: Secondary | ICD-10-CM | POA: Diagnosis not present

## 2016-03-08 DIAGNOSIS — I2510 Atherosclerotic heart disease of native coronary artery without angina pectoris: Secondary | ICD-10-CM | POA: Diagnosis not present

## 2016-03-08 DIAGNOSIS — Z79899 Other long term (current) drug therapy: Secondary | ICD-10-CM | POA: Diagnosis not present

## 2016-03-08 DIAGNOSIS — R55 Syncope and collapse: Secondary | ICD-10-CM | POA: Diagnosis not present

## 2016-03-08 DIAGNOSIS — Z7982 Long term (current) use of aspirin: Secondary | ICD-10-CM | POA: Diagnosis not present

## 2016-03-08 DIAGNOSIS — I4891 Unspecified atrial fibrillation: Secondary | ICD-10-CM | POA: Diagnosis not present

## 2016-03-08 DIAGNOSIS — I480 Paroxysmal atrial fibrillation: Secondary | ICD-10-CM | POA: Diagnosis not present

## 2016-03-09 DIAGNOSIS — I4891 Unspecified atrial fibrillation: Secondary | ICD-10-CM | POA: Diagnosis not present

## 2016-03-10 DIAGNOSIS — I4891 Unspecified atrial fibrillation: Secondary | ICD-10-CM | POA: Diagnosis not present

## 2016-03-13 NOTE — Progress Notes (Signed)
Cardiology Office Note  Date: 03/14/2016   ID: Hannah Estes, DOB 11/30/1925, MRN [id number]  PCP: Ernestine ConradBLUTH, KIRK, MD  Primary Cardiologist: Nona DellSamuel Charles Andringa, MD   Chief Complaint  Patient presents with  . Atrial Fibrillation    History of Present Illness: Hannah SeveranceFern M Hem is an 82 y.o. female last seen in November 2016. She presents for a follow-up visit. I reviewed interval records, [facility]. She reportedly spontaneously converted to sinus rhythm with heart rate control and was discharged on Cardizem CD 180 mg daily. She is here today with a friend for follow-up. ECG today shows that she is still in atrial fibrillation, although heart rate is controlled at this time. She does not feel weak as she did before. She has had no chest pain.  CHADSVASC score is 4. Annual risk of stroke on aspirin is approximately 5.1% versus 1.7% on agent such as Eliquis. We discussed this today.  She does not have any history of stroke, although she has had some memory problems in the last few months according to her friend. Still remains very active, enjoys dancing. She does not report any angina symptoms.  Past Medical History:  Diagnosis Date  . Carotid artery disease (HCC)   . COPD (chronic obstructive pulmonary disease) (HCC)   . Coronary artery disease    Multivessel, LVEF 65%  . GERD (gastroesophageal reflux disease)   . Hyperlipidemia   . Myocardial infarction    NSTEMI 6/09    Past Surgical History:  Procedure Laterality Date  . CATARACT EXTRACTION W/PHACO  03/05/2012   Procedure: CATARACT EXTRACTION PHACO AND INTRAOCULAR LENS PLACEMENT (IOC);  Surgeon: Gemma PayorKerry Hunt, MD;  Location: [facility];  Service: Ophthalmology;  Laterality: Left;  CDE 25.71  . CATARACT EXTRACTION W/PHACO  03/19/2012   Procedure: CATARACT EXTRACTION PHACO AND INTRAOCULAR LENS PLACEMENT (IOC);  Surgeon: Gemma PayorKerry Hunt, MD;  Location: [facility];  Service:  Ophthalmology;  Laterality: Right;  CDE:24.94  . CORONARY ARTERY BYPASS GRAFT  2009   Off pump LIMA to LAD, SVG to first obtuse marginal , SVG to PDA      Current Outpatient Prescriptions  Medication Sig Dispense Refill  . albuterol (PROVENTIL HFA;VENTOLIN HFA) 108 (90 BASE) MCG/ACT inhaler Inhale 2 puffs into the lungs every 6 (six) hours as needed for wheezing or shortness of breath.    . budesonide-formoterol (SYMBICORT) 80-4.5 MCG/ACT inhaler Inhale 1 puff into the lungs 2 (two) times daily as needed.     . Multiple Vitamins-Minerals (MULTIVITAMIN WITH MINERALS) tablet Take 1 tablet by mouth every other day.     . nitroGLYCERIN (NITROSTAT) 0.4 MG SL tablet Place 0.4 mg under the tongue every 5 (five) minutes as needed. Chest pain.    . Omega-3 Fatty Acids (FISH OIL) 1200 MG CAPS Take 2 capsules by mouth 2 (two) times daily. OTC medication, 1200 mg capsule, only delivers 900 mg of fish oil, called a mini capsule    . apixaban (ELIQUIS) 5 MG TABS tablet Take 1 tablet (5 mg total) by mouth 2 (two) times daily. 60 tablet 6  . diltiazem (CARDIZEM CD) 180 MG 24 hr capsule Take 1 capsule (180 mg total) by mouth daily. 90 capsule 3   No current facility-administered medications for this visit.    Facility-Administered Medications Ordered in Other Visits  Medication Dose Route Frequency Provider Last Rate Last Dose  . neomycin-polymyxin-dexameth (MAXITROL) 0.1 % ophth ointment    PRN Gemma PayorKerry Hunt,  MD   1 application at 03/05/12 0735   Allergies:  Levaquin [levofloxacin in d5w] and Penicillins   Social History: The patient  reports that she quit smoking about 18 years ago. Her smoking use included Cigarettes. She has a 16.00 pack-year smoking history. She has never used smokeless tobacco. She reports that she does not drink alcohol or use drugs.   ROS:  Please see the history of present illness. Otherwise, complete review of systems is positive for reported memory trouble.  All other systems are  reviewed and negative.   Physical Exam: VS:  BP 116/66   Pulse (!) 54   Ht 5\' 4"  (1.626 m)   Wt 147 lb (66.7 kg)   BMI 25.23 kg/m , BMI Body mass index is 25.23 kg/m.  Wt Readings from Last 3 Encounters:  03/14/16 147 lb (66.7 kg)  01/27/15 182 lb 6.4 oz (82.7 kg)  01/29/14 176 lb (79.8 kg)    Elderly woman, appears comfortable at rest. HEENT: Conjunctiva and lids normal, oropharynx with moist mucosa.  Neck: Supple, no elevated JVP, soft left bruit..  Lungs: Diminished breath sounds, slight end expiratory rhonchi. No active wheezing.  Cardiac: Irregularly irregular, no S3. Thorax: Chest wall stable status post sternotomy.  Skin: Warm and dry.  Musculoskeletal: No gross deformities.  Extremities: No pitting edema, distal pulses one plus.   ECG: I personally reviewed the tracing from 03/08/2016 which showed atrial fibrillation with decreased R wave progression and nonspecific T-wave changes.  Recent Labwork:  December 2017: Hemoglobin 14.3, platelets 227, AST 34, ALT 14, BUN 11, creatinine 0.7, TSH 30, free T4 0.69, troponin T less than 0.01  Other Studies Reviewed Today:  Carotid Dopplers 07/09/2015: Stable 1-39% bilateral ICA stenoses.  Chest x-ray 03/08/2016 [facility] No focal airspace disease.  Assessment and Plan:  1. Persistent atrial fibrillation, recently diagnosed with CHADSVASC score of 4. Heart rate is controlled on diltiazem CD. We discussed stroke prophylaxis as outlined above and will plan to switch from aspirin to Eliquis. Echocardiogram will be obtained to reassess cardiac structure and function. Office follow-up arranged. Reviewed her recent lab work.  2. CAD status post CABG, symptomatically stable without active angina symptoms. Over time she has preferred to hold off on ischemic testing. Recent troponin I levels were normal.  3. Carotid artery disease, mild by Dopplers earlier this year.  4. Hyperlipidemia with history of statin  intolerance.  Current medicines were reviewed with the patient today.   Orders Placed This Encounter  Procedures  . EKG 12-Lead  . ECHOCARDIOGRAM COMPLETE    Disposition: Follow-up in the next [place] weeks in [place].  Signed, Jonelle SidleSamuel G. Benjiman Sedgwick, MD, [facility] 03/14/2016 4:39 PM    [facility] at [facility] [address], [place], [place] [address] Phone: [phone](336) [phone]; Fax: [phone](336) [phone]

## 2016-03-14 ENCOUNTER — Ambulatory Visit (INDEPENDENT_AMBULATORY_CARE_PROVIDER_SITE_OTHER): Payer: Medicare Other | Admitting: Cardiology

## 2016-03-14 ENCOUNTER — Encounter: Payer: Self-pay | Admitting: Cardiology

## 2016-03-14 VITALS — BP 116/66 | HR 54 | Ht 64.0 in | Wt 147.0 lb

## 2016-03-14 DIAGNOSIS — I779 Disorder of arteries and arterioles, unspecified: Secondary | ICD-10-CM | POA: Diagnosis not present

## 2016-03-14 DIAGNOSIS — I6523 Occlusion and stenosis of bilateral carotid arteries: Secondary | ICD-10-CM

## 2016-03-14 DIAGNOSIS — I4819 Other persistent atrial fibrillation: Secondary | ICD-10-CM

## 2016-03-14 DIAGNOSIS — I739 Peripheral vascular disease, unspecified: Secondary | ICD-10-CM

## 2016-03-14 DIAGNOSIS — E782 Mixed hyperlipidemia: Secondary | ICD-10-CM

## 2016-03-14 DIAGNOSIS — I481 Persistent atrial fibrillation: Secondary | ICD-10-CM | POA: Diagnosis not present

## 2016-03-14 DIAGNOSIS — I2510 Atherosclerotic heart disease of native coronary artery without angina pectoris: Secondary | ICD-10-CM

## 2016-03-14 MED ORDER — DILTIAZEM HCL ER COATED BEADS 180 MG PO CP24: 180.0000 mg | ORAL_CAPSULE | Freq: Every day | ORAL | 3 refills | Status: DC

## 2016-03-14 MED ORDER — APIXABAN 5 MG PO TABS: 5.0000 mg | ORAL_TABLET | Freq: Two times a day (BID) | ORAL | 6 refills | Status: DC

## 2016-03-14 NOTE — Patient Instructions (Signed)
Your physician recommends that you schedule a follow-up appointment in: [facility] months [facility]  Your physician has requested that you have an echocardiogram. Echocardiography is a painless test that uses sound waves to create images of your heart. It provides your doctor with information about the size and shape of your heart and how well your heart's chambers and valves are working. This procedure takes approximately one hour. There are no restrictions for this procedure.    STOP Aspirin   START Eliquis 5 mg twice a day     Thank you for choosing [facility] !

## 2016-03-15 ENCOUNTER — Telehealth: Payer: Self-pay | Admitting: Cardiology

## 2016-03-15 ENCOUNTER — Other Ambulatory Visit: Payer: Self-pay | Admitting: *Deleted

## 2016-03-15 MED ORDER — DILTIAZEM HCL ER COATED BEADS 180 MG PO CP24: 180.0000 mg | ORAL_CAPSULE | Freq: Every day | ORAL | 3 refills | Status: DC

## 2016-03-15 NOTE — Telephone Encounter (Signed)
LM to return call - pt then walked into office requesting we send in diltiazem to pharmacy - was printed yesterday - pt says she already picked up Eliquis - Medication sent to pharmacy.

## 2016-03-15 NOTE — Telephone Encounter (Signed)
Patient called stating that she needs to speak with someone about her Eliquis 5 mg twice a day

## 2016-03-22 ENCOUNTER — Telehealth: Payer: Self-pay | Admitting: Cardiology

## 2016-03-22 ENCOUNTER — Other Ambulatory Visit: Payer: Self-pay

## 2016-03-22 MED ORDER — DILTIAZEM HCL ER COATED BEADS 180 MG PO CP24: 180.0000 mg | ORAL_CAPSULE | Freq: Every day | ORAL | 3 refills | Status: DC

## 2016-03-22 NOTE — Telephone Encounter (Signed)
Hannah Estes walked into the office today stating that she had been [facility] Dr. Elige RadonBradley. She only had an RX # . She states that she shredded this medication into a shredder.  She is now wanting to see if our doctors can refill the medication. She states that she is going to ask [facility] contact our office about this medication. She did not have a name for the medicine only an RX # .

## 2016-03-22 NOTE — Telephone Encounter (Signed)
Mrs. Hannah Estes needs refill on Diltiazem [address]   [facility]. Patient's telephone # is not working. She will check with pharmacy after lunch.

## 2016-03-22 NOTE — Telephone Encounter (Signed)
Tried to contact pt, to see what medication she needed filled, no answer- left message for pt to return my call.

## 2016-03-22 NOTE — Telephone Encounter (Signed)
Sent in medication refill to [facility].

## 2016-03-23 NOTE — Telephone Encounter (Signed)
Call placed to Hannah Estes's pharm - confirmed that they do have new prescriptions for her Eliquis & Cardizem CD.   Returned call to patient & notified her of above.

## 2016-04-06 ENCOUNTER — Other Ambulatory Visit: Payer: Self-pay

## 2016-04-06 ENCOUNTER — Ambulatory Visit (INDEPENDENT_AMBULATORY_CARE_PROVIDER_SITE_OTHER): Payer: Medicare Other

## 2016-04-06 DIAGNOSIS — I481 Persistent atrial fibrillation: Secondary | ICD-10-CM

## 2016-04-06 DIAGNOSIS — I4819 Other persistent atrial fibrillation: Secondary | ICD-10-CM

## 2016-04-07 ENCOUNTER — Telehealth: Payer: Self-pay | Admitting: *Deleted

## 2016-04-07 NOTE — Telephone Encounter (Signed)
Patient informed and copy sent to PCP. 

## 2016-04-07 NOTE — Telephone Encounter (Signed)
-----   Message from Jonelle SidleSamuel G McDowell, MD sent at 04/07/2016  9:42 AM EST ----- Results reviewed. LVEF is mildly reduced in the setting of atrial fibrillation. We will plan to continue with current medical therapy. A copy of this test should be forwarded to Ernestine ConradBLUTH, KIRK, MD.

## 2016-04-15 ENCOUNTER — Other Ambulatory Visit: Payer: Self-pay | Admitting: Cardiology

## 2016-04-15 ENCOUNTER — Telehealth: Payer: Self-pay | Admitting: Cardiology

## 2016-04-15 NOTE — Telephone Encounter (Signed)
Patient walked in wanting to speak with a nurse about her Eliquis.  She was told by the pharmacy that she would be responsible for full amount. That they could not file with insurance????   [facility]

## 2016-04-15 NOTE — Telephone Encounter (Signed)
Pt called stating that she could not get Eliquis and she will run out on Sunday, they are now closed.  We need to call [facility] to understand what they need so she can get Eliquis.  We will try to find some samples over the week end to get pt.  If possible.  She called at 730 PM and [facility] closed at 530 pm.  I will send to Dr. Diona BrownerMcDowell and his assistant and to NP covering tomorrow.

## 2016-04-16 NOTE — Telephone Encounter (Signed)
Pt called stating her Eliquis was going to cost around 150$ out of pocket. I called and talked with the pharmacy and was instructed this was the deductible with her insurance. Called and talked with the patient and informed that I could switch her to Xarelto and give another 30day free card but she would still have this out of pocket expense after card is used. She opted to pay for the Eliquis today and pay the deductible. Her medications remained the same.  Laverda PageLindsay Shazia Mitchener NP-C

## 2016-04-18 MED ORDER — APIXABAN 5 MG PO TABS: 5.0000 mg | ORAL_TABLET | Freq: Two times a day (BID) | ORAL | 6 refills | Status: DC

## 2016-05-17 ENCOUNTER — Ambulatory Visit (INDEPENDENT_AMBULATORY_CARE_PROVIDER_SITE_OTHER): Payer: Medicare Other | Admitting: Cardiology

## 2016-05-17 ENCOUNTER — Encounter: Payer: Self-pay | Admitting: Cardiology

## 2016-05-17 VITALS — BP 126/69 | HR 89 | Ht 66.0 in | Wt 147.2 lb

## 2016-05-17 DIAGNOSIS — I481 Persistent atrial fibrillation: Secondary | ICD-10-CM | POA: Diagnosis not present

## 2016-05-17 DIAGNOSIS — I429 Cardiomyopathy, unspecified: Secondary | ICD-10-CM | POA: Diagnosis not present

## 2016-05-17 DIAGNOSIS — I2510 Atherosclerotic heart disease of native coronary artery without angina pectoris: Secondary | ICD-10-CM | POA: Diagnosis not present

## 2016-05-17 DIAGNOSIS — E782 Mixed hyperlipidemia: Secondary | ICD-10-CM

## 2016-05-17 DIAGNOSIS — I4819 Other persistent atrial fibrillation: Secondary | ICD-10-CM

## 2016-05-17 NOTE — Patient Instructions (Signed)
Your physician recommends that you schedule a follow-up appointment in: 3 months with Dr. McDowell  Your physician recommends that you continue on your current medications as directed. Please refer to the Current Medication list given to you today.  Thank you for choosing [facility]!!    

## 2016-05-17 NOTE — Progress Notes (Signed)
Cardiology Office Note  Date: 05/17/2016   ID: Hannah SeveranceFern M Sterry, DOB 01/15/1926, MRN [id number]  PCP: Ernestine ConradBLUTH, KIRK, MD  Primary Cardiologist: Nona DellSamuel Yussuf Sawyers, MD   Chief Complaint  Patient presents with  . Atrial Fibrillation    History of Present Illness: Hannah Estes is an 83 y.o. female last seen in December 2017. She is here today for a follow-up visit. We had a long talk about her medications today. She does not know them by name, we called her pharmacy to verify that she was filling the correct medications. She does not report any palpitations at this time orr chest pain. States that she is going dancing 4 days a week.  CHADSVASC score is 4. She has been on Eliquis for stroke prophylaxis. No bleeding problems. Lab work from December 2017 is outlined below.  Echocardiogram from January as outlined below, LVEF 45-50% in the setting of atrial fibrillation. Mild to moderate mitral regurgitation noted and mild left atrial enlargement.  Past Medical History:  Diagnosis Date  . Carotid artery disease (HCC)   . COPD (chronic obstructive pulmonary disease) (HCC)   . Coronary artery disease    Multivessel, LVEF 65%  . GERD (gastroesophageal reflux disease)   . Hyperlipidemia   . Myocardial infarction    NSTEMI 6/09    Past Surgical History:  Procedure Laterality Date  . CATARACT EXTRACTION W/PHACO  03/05/2012   Procedure: CATARACT EXTRACTION PHACO AND INTRAOCULAR LENS PLACEMENT (IOC);  Surgeon: Gemma PayorKerry Hunt, MD;  Location: [facility];  Service: Ophthalmology;  Laterality: Left;  CDE 25.71  . CATARACT EXTRACTION W/PHACO  03/19/2012   Procedure: CATARACT EXTRACTION PHACO AND INTRAOCULAR LENS PLACEMENT (IOC);  Surgeon: Gemma PayorKerry Hunt, MD;  Location: [facility];  Service: Ophthalmology;  Laterality: Right;  CDE:24.94  . CORONARY ARTERY BYPASS GRAFT  2009   Off pump LIMA to LAD, SVG to first obtuse marginal , SVG to PDA      Current Outpatient Prescriptions  Medication Sig Dispense Refill    . albuterol (PROVENTIL HFA;VENTOLIN HFA) 108 (90 BASE) MCG/ACT inhaler Inhale 2 puffs into the lungs every 6 (six) hours as needed for wheezing or shortness of breath.    Marland Kitchen. apixaban (ELIQUIS) 5 MG TABS tablet Take 1 tablet (5 mg total) by mouth 2 (two) times daily. 60 tablet 6  . budesonide-formoterol (SYMBICORT) 80-4.5 MCG/ACT inhaler Inhale 1 puff into the lungs 2 (two) times daily as needed.     . diltiazem (CARDIZEM CD) 180 MG 24 hr capsule Take 1 capsule (180 mg total) by mouth daily. 90 capsule 3  . Multiple Vitamins-Minerals (MULTIVITAMIN WITH MINERALS) tablet Take 1 tablet by mouth every other day.     . nitroGLYCERIN (NITROSTAT) 0.4 MG SL tablet Place 0.4 mg under the tongue every 5 (five) minutes as needed. Chest pain.    . Omega-3 Fatty Acids (FISH OIL) 1200 MG CAPS Take 2 capsules by mouth 2 (two) times daily. OTC medication, 1200 mg capsule, only delivers 900 mg of fish oil, called a mini capsule     No current facility-administered medications for this visit.    Facility-Administered Medications Ordered in Other Visits  Medication Dose Route Frequency Provider Last Rate Last Dose  . neomycin-polymyxin-dexameth (MAXITROL) 0.1 % ophth ointment    PRN Gemma PayorKerry Hunt, MD   1 application at 03/05/12 [id number]   Allergies:  Levaquin [levofloxacin in d5w] and Penicillins   Social History: The patient  reports that she quit smoking about 19 years ago. Her smoking  use included Cigarettes. She has a 16.00 pack-year smoking history. She has never used smokeless tobacco. She reports that she does not drink alcohol or use drugs.   ROS:  Please see the history of present illness. Otherwise, complete review of systems is positive for trouble with memory.  All other systems are reviewed and negative.   Physical Exam: VS:  BP 126/69   Pulse 89   Ht 5\' 6"  (1.676 m)   Wt 147 lb 3.2 oz (66.8 kg)   SpO2 92%   BMI 23.76 kg/m , BMI Body mass index is 23.76 kg/m.  Wt Readings from Last 3 Encounters:   05/17/16 147 lb 3.2 oz (66.8 kg)  03/14/16 147 lb (66.7 kg)  01/27/15 182 lb 6.4 oz (82.7 kg)    Elderly woman, appears comfortable at rest. HEENT: Conjunctiva and lids normal, oropharynx with moist mucosa.  Neck: Supple, no elevated JVP, soft left bruit..  Lungs: Diminished breath sounds, slight end expiratory rhonchi. No active wheezing.  Cardiac: Irregularly irregular, no S3. Thorax: Chest wall stable status post sternotomy.  Skin: Warm and dry.  Musculoskeletal: No gross deformities.  Extremities: No pitting edema, distal pulses one plus.  ECG: I personally reviewed the tracing from 03/14/2016 which showed rate-controlled atrial relation with PVC, nonspecific ST-T changes.  Recent Labwork:  December 2017: Hemoglobin 14.3, platelets 227, AST 34, ALT 14, BUN 11, creatinine 0.7, TSH 30, free T4 0.69, troponin T less than 0.01  Other Studies Reviewed Today:  Echocardiogram 04/06/2016: Study Conclusions  - Left ventricle: The cavity size was normal. Wall thickness was   increased in a pattern of mild LVH. Systolic function was mildly   reduced. The estimated ejection fraction was in the range of 45%   to 50%. There is hypokinesis of the apicalanteroseptal   myocardium. There is hypokinesis of the midinferolateral   myocardium. The study is not technically sufficient to allow   evaluation of LV diastolic function. - Aortic valve: Mildly to moderately calcified annulus. Trileaflet;   mildly calcified leaflets. - Mitral valve: Calcified annulus. Mildly thickened leaflets .   There was mild to moderate regurgitation. - Left atrium: The atrium was mildly dilated. - Atrial septum: No defect or patent foramen ovale was identified. - Tricuspid valve: There was mild regurgitation. - Pulmonary arteries: PA peak pressure: 22 mm Hg (S). - Pericardium, extracardiac: There was no pericardial effusion.  Impressions:  - Mild LVH with LVEF 45-50%. Mild hypokinesis of the apical    anteroseptal and mid inferolateral walls noted. Indeterminate   diastolic function in the setting of atrial fibrillation. Mild   left atrial enlargement. Mildly calcified mitral annulus with   mildly thickened leaflets and mild to moderate mitral   regurgitation. Aortic valve sclerosis without stenosis. Mild   tricuspid regurgitation with PASP estimated 22 mmHg.  Assessment and Plan:  1. Persistent atrial fibrillation. Continue strategy of heart rate control and anticoagulation. She is on Eliquis 5 mg twice daily and Cardizem CD 180 mg daily. We discussed these medications and gave her a list. She does not report any bleeding problems.  2. Cardiomyopathy, LVEF 45-50% in the setting of atrial fibrillation. Will reassess later after heart rate has been controlled on medical therapy longer.  3. Multivessel CAD status post CABG in 2009. No active angina symptoms. Continue observation on medical therapy for now.  4. History of hyperlipidemia with statin intolerance. She is on omega-3 supplement.  Current medicines were reviewed with the patient today.  Disposition: Follow-up in  4 months.  Signed, Jonelle Sidle, MD, [facility] 05/17/2016 2:11 PM    [facility] at [facility] [address] [place], [place], [place] [address] Phone: [phone]; Fax: [phone]

## 2016-05-24 DIAGNOSIS — I10 Essential (primary) hypertension: Secondary | ICD-10-CM | POA: Diagnosis not present

## 2016-05-24 DIAGNOSIS — I480 Paroxysmal atrial fibrillation: Secondary | ICD-10-CM | POA: Diagnosis not present

## 2016-05-24 DIAGNOSIS — J45909 Unspecified asthma, uncomplicated: Secondary | ICD-10-CM | POA: Diagnosis not present

## 2016-08-04 NOTE — Telephone Encounter (Signed)
Completed.

## 2016-08-15 NOTE — Progress Notes (Signed)
Cardiology Office Note  Date: 08/16/2016   ID: MERIDETH BOSQUE, DOB 1925-05-20, MRN [id number]  PCP: Ernestine Conrad, MD  Primary Cardiologist: Nona Dell, MD   Chief Complaint  Patient presents with  . Atrial Fibrillation  . Coronary Artery Disease    History of Present Illness: Hannah Estes is an 83 y.o. female last seen in February. She presents today for a routine follow-up visit. Still seems to me to have trouble with her memory in talking with her. She continues to dance regularly 4 days a week. Does not report any angina symptoms. She also tells me that she is taking her medications which we reviewed below.  She does not report any spontaneous bleeding problems on Eliquis. Last lab work was approximately 6 months ago. We discussed getting it updated.  Echocardiogram from January of this year is outlined below, LVEF 45-50% range. She reports NYHA class II dyspnea, no orthopnea or PND.  Past Medical History:  Diagnosis Date  . Carotid artery disease (HCC)   . COPD (chronic obstructive pulmonary disease) (HCC)   . Coronary artery disease    Multivessel, LVEF 65%  . GERD (gastroesophageal reflux disease)   . Hyperlipidemia   . Myocardial infarction [facility]    NSTEMI 6/09    Past Surgical History:  Procedure Laterality Date  . CATARACT EXTRACTION W/PHACO  03/05/2012   Procedure: CATARACT EXTRACTION PHACO AND INTRAOCULAR LENS PLACEMENT (IOC);  Surgeon: Gemma Payor, MD;  Location: [facility];  Service: Ophthalmology;  Laterality: Left;  CDE 25.71  . CATARACT EXTRACTION W/PHACO  03/19/2012   Procedure: CATARACT EXTRACTION PHACO AND INTRAOCULAR LENS PLACEMENT (IOC);  Surgeon: Gemma Payor, MD;  Location: [facility];  Service: Ophthalmology;  Laterality: Right;  CDE:24.94  . CORONARY ARTERY BYPASS GRAFT  2009   Off pump LIMA to LAD, SVG to first obtuse marginal , SVG to PDA      Current Outpatient Prescriptions  Medication Sig Dispense Refill  . albuterol (PROVENTIL HFA;VENTOLIN  HFA) 108 (90 BASE) MCG/ACT inhaler Inhale 2 puffs into the lungs every 6 (six) hours as needed for wheezing or shortness of breath.    Marland Kitchen apixaban (ELIQUIS) 5 MG TABS tablet Take 1 tablet (5 mg total) by mouth 2 (two) times daily. 60 tablet 6  . budesonide-formoterol (SYMBICORT) 80-4.5 MCG/ACT inhaler Inhale 1 puff into the lungs 2 (two) times daily as needed.     . diltiazem (CARDIZEM CD) 180 MG 24 hr capsule Take 1 capsule (180 mg total) by mouth daily. 90 capsule 3  . Multiple Vitamins-Minerals (MULTIVITAMIN WITH MINERALS) tablet Take 1 tablet by mouth every other day.     . nitroGLYCERIN (NITROSTAT) 0.4 MG SL tablet Place 0.4 mg under the tongue every 5 (five) minutes as needed. Chest pain.    . Omega-3 Fatty Acids (FISH OIL) 1200 MG CAPS Take 2 capsules by mouth 2 (two) times daily. OTC medication, 1200 mg capsule, only delivers 900 mg of fish oil, called a mini capsule     No current facility-administered medications for this visit.    Facility-Administered Medications Ordered in Other Visits  Medication Dose Route Frequency Provider Last Rate Last Dose  . neomycin-polymyxin-dexameth (MAXITROL) 0.1 % ophth ointment    PRN Gemma Payor, MD   1 application at 03/05/12 [id number]   Allergies:  Levaquin [levofloxacin in d5w] and Penicillins   Social History: The patient  reports that she quit smoking about 19 years ago. Her smoking use included Cigarettes. She has a 16.00  pack-year smoking history. She has never used smokeless tobacco. She reports that she does not drink alcohol or use drugs.   ROS:  Please see the history of present illness. Otherwise, complete review of systems is positive for memory loss.  All other systems are reviewed and negative.   Physical Exam: VS:  BP 115/73   Pulse 86   Ht 5\' 6"  (1.676 m)   Wt 148 lb 6.4 oz (67.3 kg)   SpO2 94%   BMI 23.95 kg/m , BMI Body mass index is 23.95 kg/m.  Wt Readings from Last 3 Encounters:  08/16/16 148 lb 6.4 oz (67.3 kg)  05/17/16  147 lb 3.2 oz (66.8 kg)  03/14/16 147 lb (66.7 kg)    Elderly woman, appears comfortableat rest. HEENT: Conjunctiva and lids normal, oropharynx with moist mucosa.  Neck: Supple, no elevated JVP, soft left bruit..  Lungs: Diminished breath sounds, slight end expiratory rhonchi. No active wheezing.  Cardiac: Irregularly irregular, no S3. Thorax: Chest wall stable status post sternotomy.  Skin: Warm and dry.  Musculoskeletal: No gross deformities.  Extremities: No pitting edema, distal pulses one plus.  ECG: I personally reviewed the tracing from 03/14/2016 which showed rate-controlled atrial relation with PVC, nonspecific ST-T changes.  Recent Labwork:  December 2017: Hemoglobin 14.3, platelets 227, AST 34, ALT 14, BUN 11, creatinine 0.7, TSH 30, free T4 0.69, troponin T less than 0.01  Other Studies Reviewed Today:  Echocardiogram 04/06/2016: Study Conclusions  - Left ventricle: The cavity size was normal. Wall thickness was increased in a pattern of mild LVH. Systolic function was mildly reduced. The estimated ejection fraction was in the range of 45% to 50%. There is hypokinesis of the apicalanteroseptal myocardium. There is hypokinesis of the midinferolateral myocardium. The study is not technically sufficient to allow evaluation of LV diastolic function. - Aortic valve: Mildly to moderately calcified annulus. Trileaflet; mildly calcified leaflets. - Mitral valve: Calcified annulus. Mildly thickened leaflets . There was mild to moderate regurgitation. - Left atrium: The atrium was mildly dilated. - Atrial septum: No defect or patent foramen ovale was identified. - Tricuspid valve: There was mild regurgitation. - Pulmonary arteries: PA peak pressure: 22 mm Hg (S). - Pericardium, extracardiac: There was no pericardial effusion.  Impressions:  - Mild LVH with LVEF 45-50%. Mild hypokinesis of the apical anteroseptal and mid inferolateral walls  noted. Indeterminate diastolic function in the setting of atrial fibrillation. Mild left atrial enlargement. Mildly calcified mitral annulus with mildly thickened leaflets and mild to moderate mitral regurgitation. Aortic valve sclerosis without stenosis. Mild tricuspid regurgitation with PASP estimated 22 mmHg.  Assessment and Plan:  1. Chronic atrial fibrillation at this point. She does not report any palpitations and we continue with heart rate control and anticoagulation strategy. She remains on Eliquis and Cardizem CD. Follow-up CBC and BMET will be obtained.  2. Cardiomyopathy with mild LV dysfunction, LVEF 45-50%. No evidence of volume overload.  3. History of multivessel CAD status post CABG in 2009. Plan to continue conservative medical therapy. She does not report any angina symptoms.  4. Hyperlipidemia with known statin intolerance.  Current medicines were reviewed with the patient today.   Orders Placed This Encounter  Procedures  . CBC  . Basic Metabolic Panel (BMET)    Disposition: Follow-up in 6 months.  Signed, Jonelle SidleSamuel G. McDowell, MD, [facility] 08/16/2016 2:04 PM    [facility] [address], [place], [place] [address] Phone: [phone]; Fax: [phone]

## 2016-08-16 ENCOUNTER — Other Ambulatory Visit: Payer: Self-pay

## 2016-08-16 ENCOUNTER — Ambulatory Visit (INDEPENDENT_AMBULATORY_CARE_PROVIDER_SITE_OTHER): Payer: Medicare Other | Admitting: Cardiology

## 2016-08-16 ENCOUNTER — Encounter: Payer: Self-pay | Admitting: Cardiology

## 2016-08-16 VITALS — BP 115/73 | HR 86 | Ht 66.0 in | Wt 148.4 lb

## 2016-08-16 DIAGNOSIS — I482 Chronic atrial fibrillation, unspecified: Secondary | ICD-10-CM

## 2016-08-16 DIAGNOSIS — I481 Persistent atrial fibrillation: Secondary | ICD-10-CM | POA: Diagnosis not present

## 2016-08-16 DIAGNOSIS — E782 Mixed hyperlipidemia: Secondary | ICD-10-CM | POA: Diagnosis not present

## 2016-08-16 DIAGNOSIS — I429 Cardiomyopathy, unspecified: Secondary | ICD-10-CM

## 2016-08-16 DIAGNOSIS — I2510 Atherosclerotic heart disease of native coronary artery without angina pectoris: Secondary | ICD-10-CM | POA: Diagnosis not present

## 2016-08-16 DIAGNOSIS — I4819 Other persistent atrial fibrillation: Secondary | ICD-10-CM

## 2016-08-16 NOTE — Patient Instructions (Signed)
Medication Instructions:  Your physician recommends that you continue on your current medications as directed. Please refer to the Current Medication list given to you today.  Labwork: CBC,BMET   Testing/Procedures: NONE  Follow-Up: Your physician wants you to follow-up in: 6 MONTHS WITH DR. MCDOWELL You will receive a reminder letter in the mail two months in advance. If you don't receive a letter, please call our office to schedule the follow-up appointment.  Any Other Special Instructions Will Be Listed Below (If Applicable).  If you need a refill on your cardiac medications before your next appointment, please call your pharmacy. 

## 2016-08-17 ENCOUNTER — Telehealth: Payer: Self-pay

## 2016-08-17 NOTE — Telephone Encounter (Signed)
-----   Message from Jonelle SidleSamuel G McDowell, MD sent at 08/17/2016  7:48 AM EDT ----- Results reviewed. Renal function and hemoglobin normal. Continue with current therapy. A copy of this test should be forwarded to Ernestine ConradBluth, Kirk, MD.

## 2016-08-17 NOTE — Telephone Encounter (Signed)
Patient notified. Routed to PCP 

## 2016-09-19 DIAGNOSIS — Z6828 Body mass index (BMI) 28.0-28.9, adult: Secondary | ICD-10-CM | POA: Diagnosis not present

## 2016-09-19 DIAGNOSIS — Z299 Encounter for prophylactic measures, unspecified: Secondary | ICD-10-CM | POA: Diagnosis not present

## 2016-09-19 DIAGNOSIS — I10 Essential (primary) hypertension: Secondary | ICD-10-CM | POA: Diagnosis not present

## 2016-09-19 DIAGNOSIS — Z789 Other specified health status: Secondary | ICD-10-CM | POA: Diagnosis not present

## 2016-09-19 DIAGNOSIS — I4891 Unspecified atrial fibrillation: Secondary | ICD-10-CM | POA: Diagnosis not present

## 2016-09-27 ENCOUNTER — Other Ambulatory Visit: Payer: Self-pay | Admitting: Cardiology

## 2016-09-27 MED ORDER — DILTIAZEM HCL ER COATED BEADS 180 MG PO CP24: 180.0000 mg | ORAL_CAPSULE | Freq: Every day | ORAL | 1 refills | Status: DC

## 2016-09-27 MED ORDER — APIXABAN 5 MG PO TABS: 5.0000 mg | ORAL_TABLET | Freq: Two times a day (BID) | ORAL | 1 refills | Status: DC

## 2016-09-27 NOTE — Telephone Encounter (Signed)
Medication sent to pharmacy  

## 2016-09-27 NOTE — Telephone Encounter (Signed)
Mrs. Hannah Estes walked in requesting to have Eliquis 5mg  and Diltiazem 24 hr refilled Please call [name] .

## 2016-11-11 ENCOUNTER — Other Ambulatory Visit: Payer: Self-pay | Admitting: Cardiology

## 2017-02-13 NOTE — Progress Notes (Deleted)
Cardiology Office Note  Date: 02/13/2017   ID: Hannah Estes, DOB Jun 09, 1925, MRN [id number]  PCP: Ernestine ConradBluth, Kirk, MD  Primary Cardiologist: Nona DellSamuel Tesha Archambeau, MD   No chief complaint on file.   History of Present Illness: Hannah Estes is an 83 y.o. female last seen in May.  Past Medical History:  Diagnosis Date  . Carotid artery disease (HCC)   . COPD (chronic obstructive pulmonary disease) (HCC)   . Coronary artery disease    Multivessel, LVEF 65%  . GERD (gastroesophageal reflux disease)   . Hyperlipidemia   . Myocardial infarction [facility](HCC)    NSTEMI 6/09    Past Surgical History:  Procedure Laterality Date  . CATARACT EXTRACTION PHACO AND INTRAOCULAR LENS PLACEMENT (IOC) Right 03/19/2012   Performed by Gemma PayorHunt, Kerry, MD at [facility]  . CATARACT EXTRACTION PHACO AND INTRAOCULAR LENS PLACEMENT (IOC) Left 03/05/2012   Performed by Gemma PayorHunt, Kerry, MD at [facility]  . CORONARY ARTERY BYPASS GRAFT  2009   Off pump LIMA to LAD, SVG to first obtuse marginal , SVG to PDA      Current Outpatient Medications  Medication Sig Dispense Refill  . albuterol (PROVENTIL HFA;VENTOLIN HFA) 108 (90 BASE) MCG/ACT inhaler Inhale 2 puffs into the lungs every 6 (six) hours as needed for wheezing or shortness of breath.    . budesonide-formoterol (SYMBICORT) 80-4.5 MCG/ACT inhaler Inhale 1 puff into the lungs 2 (two) times daily as needed.     . diltiazem (CARDIZEM CD) 180 MG 24 hr capsule Take 1 capsule (180 mg total) by mouth daily. 90 capsule 1  . ELIQUIS 5 MG TABS tablet TAKE (1) TABLET TWICE DAILY. 60 tablet 3  . Multiple Vitamins-Minerals (MULTIVITAMIN WITH MINERALS) tablet Take 1 tablet by mouth every other day.     . nitroGLYCERIN (NITROSTAT) 0.4 MG SL tablet Place 0.4 mg under the tongue every 5 (five) minutes as needed. Chest pain.    . Omega-3 Fatty Acids (FISH OIL) 1200 MG CAPS Take 2 capsules by mouth 2 (two) times daily. OTC medication, 1200 mg capsule, only delivers 900 mg of fish oil,  called a mini capsule     No current facility-administered medications for this visit.    Facility-Administered Medications Ordered in Other Visits  Medication Dose Route Frequency Provider Last Rate Last Dose  . neomycin-polymyxin-dexameth (MAXITROL) 0.1 % ophth ointment    PRN Gemma PayorHunt, Kerry, MD   1 application at 03/05/12 [id number]   Allergies:  Levaquin [levofloxacin in d5w] and Penicillins   Social History: The patient  reports that she quit smoking about 19 years ago. Her smoking use included cigarettes. She has a 16.00 pack-year smoking history. she has never used smokeless tobacco. She reports that she does not drink alcohol or use drugs.   Family History: The patient's family history is not on file.   ROS:  Please see the history of present illness. Otherwise, complete review of systems is positive for {NONE DEFAULTED:18576::"none"}.  All other systems are reviewed and negative.   Physical Exam: VS:  There were no vitals taken for this visit., BMI There is no height or weight on file to calculate BMI.  Wt Readings from Last 3 Encounters:  08/16/16 148 lb 6.4 oz (67.3 kg)  05/17/16 147 lb 3.2 oz (66.8 kg)  03/14/16 147 lb (66.7 kg)    General: Patient appears comfortable at rest. HEENT: Conjunctiva and lids normal, oropharynx clear with moist mucosa. Neck: Supple, no elevated JVP or carotid bruits, no  thyromegaly. Lungs: Clear to auscultation, nonlabored breathing at rest. Cardiac: Regular rate and rhythm, no S3 or significant systolic murmur, no pericardial rub. Abdomen: Soft, nontender, no hepatomegaly, bowel sounds present, no guarding or rebound. Extremities: No pitting edema, distal pulses 2+. Skin: Warm and dry. Musculoskeletal: No kyphosis. Neuropsychiatric: Alert and oriented x3, affect grossly appropriate.  ECG: I personally reviewed the tracing from 03/14/2016 which showed rate controlled atrial fibrillation with PVC, nonspecific ST-T changes.  Recent Labwork:  May  2018: BUN 8, creatinine 0.64, potassium 4.4, hemoglobin 14.8, platelets 261  Other Studies Reviewed Today:  Echocardiogram 04/06/2016: Study Conclusions  - Left ventricle: The cavity size was normal. Wall thickness was increased in a pattern of mild LVH. Systolic function was mildly reduced. The estimated ejection fraction was in the range of 45% to 50%. There is hypokinesis of the apicalanteroseptal myocardium. There is hypokinesis of the midinferolateral myocardium. The study is not technically sufficient to allow evaluation of LV diastolic function. - Aortic valve: Mildly to moderately calcified annulus. Trileaflet; mildly calcified leaflets. - Mitral valve: Calcified annulus. Mildly thickened leaflets . There was mild to moderate regurgitation. - Left atrium: The atrium was mildly dilated. - Atrial septum: No defect or patent foramen ovale was identified. - Tricuspid valve: There was mild regurgitation. - Pulmonary arteries: PA peak pressure: 22 mm Hg (S). - Pericardium, extracardiac: There was no pericardial effusion.  Impressions:  - Mild LVH with LVEF 45-50%. Mild hypokinesis of the apical anteroseptal and mid inferolateral walls noted. Indeterminate diastolic function in the setting of atrial fibrillation. Mild left atrial enlargement. Mildly calcified mitral annulus with mildly thickened leaflets and mild to moderate mitral regurgitation. Aortic valve sclerosis without stenosis. Mild tricuspid regurgitation with PASP estimated 22 mmHg.  Assessment and Plan:    Current medicines were reviewed with the patient today.  No orders of the defined types were placed in this encounter.   Disposition:  Signed, Jonelle SidleSamuel G. Aleenah Homen, MD, [facility] 02/13/2017 12:55 PM    [facility] [address], [place], [place] [address] Phone: [phone]([phone]; Fax: [phone]

## 2017-02-14 ENCOUNTER — Ambulatory Visit: Payer: Medicare Other | Admitting: Cardiology

## 2017-02-15 ENCOUNTER — Encounter: Payer: Self-pay | Admitting: Cardiology

## 2017-03-13 ENCOUNTER — Other Ambulatory Visit: Payer: Self-pay | Admitting: Cardiology

## 2017-03-31 ENCOUNTER — Ambulatory Visit: Payer: Medicare Other | Admitting: Cardiology

## 2017-03-31 NOTE — Progress Notes (Deleted)
Cardiology Office Note  Date: 03/31/2017   ID: Hannah Estes, DOB December 28, 1925, MRN [id number]  PCP: Ernestine Conrad, MD  Primary Cardiologist: Nona Dell, MD   No chief complaint on file.   History of Present Illness: Hannah Estes is an 85 y.o. female last seen in May 2018.  Lab work from May 2018 is outlined below.  Past Medical History:  Diagnosis Date  . Carotid artery disease (HCC)   . COPD (chronic obstructive pulmonary disease) (HCC)   . Coronary artery disease    Multivessel, LVEF 65%  . GERD (gastroesophageal reflux disease)   . Hyperlipidemia   . Myocardial infarction [facility])    NSTEMI 6/09    Past Surgical History:  Procedure Laterality Date  . CATARACT EXTRACTION W/PHACO  03/05/2012   Procedure: CATARACT EXTRACTION PHACO AND INTRAOCULAR LENS PLACEMENT (IOC);  Surgeon: Gemma Payor, MD;  Location: [facility];  Service: Ophthalmology;  Laterality: Left;  CDE 25.71  . CATARACT EXTRACTION W/PHACO  03/19/2012   Procedure: CATARACT EXTRACTION PHACO AND INTRAOCULAR LENS PLACEMENT (IOC);  Surgeon: Gemma Payor, MD;  Location: [facility];  Service: Ophthalmology;  Laterality: Right;  CDE:24.94  . CORONARY ARTERY BYPASS GRAFT  2009   Off pump LIMA to LAD, SVG to first obtuse marginal , SVG to PDA      Current Outpatient Medications  Medication Sig Dispense Refill  . albuterol (PROVENTIL HFA;VENTOLIN HFA) 108 (90 BASE) MCG/ACT inhaler Inhale 2 puffs into the lungs every 6 (six) hours as needed for wheezing or shortness of breath.    . budesonide-formoterol (SYMBICORT) 80-4.5 MCG/ACT inhaler Inhale 1 puff into the lungs 2 (two) times daily as needed.     . diltiazem (CARDIZEM CD) 180 MG 24 hr capsule Take 1 capsule (180 mg total) by mouth daily. 90 capsule 1  . ELIQUIS 5 MG TABS tablet TAKE (1) TABLET TWICE DAILY. 60 tablet 3  . ELIQUIS 5 MG TABS tablet TAKE (1) TABLET TWICE DAILY. 60 tablet 0  . Multiple Vitamins-Minerals (MULTIVITAMIN WITH MINERALS) tablet Take 1 tablet by  mouth every other day.     . nitroGLYCERIN (NITROSTAT) 0.4 MG SL tablet Place 0.4 mg under the tongue every 5 (five) minutes as needed. Chest pain.    . Omega-3 Fatty Acids (FISH OIL) 1200 MG CAPS Take 2 capsules by mouth 2 (two) times daily. OTC medication, 1200 mg capsule, only delivers 900 mg of fish oil, called a mini capsule     No current facility-administered medications for this visit.    Facility-Administered Medications Ordered in Other Visits  Medication Dose Route Frequency Provider Last Rate Last Dose  . neomycin-polymyxin-dexameth (MAXITROL) 0.1 % ophth ointment    PRN Gemma Payor, MD   1 application at 03/05/12 4098   Allergies:  Levaquin [levofloxacin in d5w] and Penicillins   Social History: The patient  reports that she quit smoking about 20 years ago. Her smoking use included cigarettes. She has a 16.00 pack-year smoking history. she has never used smokeless tobacco. She reports that she does not drink alcohol or use drugs.   Family History: The patient's family history is not on file.   ROS:  Please see the history of present illness. Otherwise, complete review of systems is positive for {NONE DEFAULTED:18576::"none"}.  All other systems are reviewed and negative.   Physical Exam: VS:  There were no vitals taken for this visit., BMI There is no height or weight on file to calculate BMI.  Wt Readings from Last  3 Encounters:  08/16/16 148 lb 6.4 oz (67.3 kg)  05/17/16 147 lb 3.2 oz (66.8 kg)  03/14/16 147 lb (66.7 kg)    General: Patient appears comfortable at rest. HEENT: Conjunctiva and lids normal, oropharynx clear with moist mucosa. Neck: Supple, no elevated JVP or carotid bruits, no thyromegaly. Lungs: Clear to auscultation, nonlabored breathing at rest. Cardiac: Regular rate and rhythm, no S3 or significant systolic murmur, no pericardial rub. Abdomen: Soft, nontender, no hepatomegaly, bowel sounds present, no guarding or rebound. Extremities: No pitting edema,  distal pulses 2+. Skin: Warm and dry. Musculoskeletal: No kyphosis. Neuropsychiatric: Alert and oriented x3, affect grossly appropriate.  ECG: I personally reviewed the tracing from 03/14/2016 which showed rate-controlled atrial fibrillation with PVC, nonspecific ST-T changes.  Recent Labwork:  December 2017: Hemoglobin 14.3, platelets 227, AST 34, ALT 14, BUN 11, creatinine 0.7, TSH 30, free T4 0.69, troponin T less than 0.26 Jul 2016: BUN 8, creatinine 0.64, potassium 4.4, hemoglobin 14.8, platelets 261  Other Studies Reviewed Today:  Echocardiogram 04/06/2016: Study Conclusions  - Left ventricle: The cavity size was normal. Wall thickness was increased in a pattern of mild LVH. Systolic function was mildly reduced. The estimated ejection fraction was in the range of 45% to 50%. There is hypokinesis of the apicalanteroseptal myocardium. There is hypokinesis of the midinferolateral myocardium. The study is not technically sufficient to allow evaluation of LV diastolic function. - Aortic valve: Mildly to moderately calcified annulus. Trileaflet; mildly calcified leaflets. - Mitral valve: Calcified annulus. Mildly thickened leaflets . There was mild to moderate regurgitation. - Left atrium: The atrium was mildly dilated. - Atrial septum: No defect or patent foramen ovale was identified. - Tricuspid valve: There was mild regurgitation. - Pulmonary arteries: PA peak pressure: 22 mm Hg (S). - Pericardium, extracardiac: There was no pericardial effusion.  Impressions:  - Mild LVH with LVEF 45-50%. Mild hypokinesis of the apical anteroseptal and mid inferolateral walls noted. Indeterminate diastolic function in the setting of atrial fibrillation. Mild left atrial enlargement. Mildly calcified mitral annulus with mildly thickened leaflets and mild to moderate mitral regurgitation. Aortic valve sclerosis without stenosis. Mild tricuspid regurgitation  with PASP estimated 22 mmHg.  Assessment and Plan:    Current medicines were reviewed with the patient today.  No orders of the defined types were placed in this encounter.   Disposition:  Signed, Jonelle SidleSamuel G. Sona Nations, MD, [facility] 03/31/2017 9:52 AM    [facility] [address], [place], [place] [address] Phone: [phone](336) [phone]; Fax: [phone]) [phone]

## 2017-04-24 DIAGNOSIS — Z6828 Body mass index (BMI) 28.0-28.9, adult: Secondary | ICD-10-CM | POA: Diagnosis not present

## 2017-04-24 DIAGNOSIS — Z789 Other specified health status: Secondary | ICD-10-CM | POA: Diagnosis not present

## 2017-04-24 DIAGNOSIS — Z299 Encounter for prophylactic measures, unspecified: Secondary | ICD-10-CM | POA: Diagnosis not present

## 2017-04-24 DIAGNOSIS — I10 Essential (primary) hypertension: Secondary | ICD-10-CM | POA: Diagnosis not present

## 2017-04-24 DIAGNOSIS — I4891 Unspecified atrial fibrillation: Secondary | ICD-10-CM | POA: Diagnosis not present

## 2017-05-08 ENCOUNTER — Ambulatory Visit: Payer: Medicare Other | Admitting: Cardiology

## 2017-05-15 ENCOUNTER — Other Ambulatory Visit: Payer: Self-pay

## 2017-05-15 MED ORDER — APIXABAN 5 MG PO TABS: ORAL_TABLET | ORAL | 0 refills | Status: DC

## 2017-05-30 NOTE — Progress Notes (Signed)
Cardiology Office Note  Date: 05/31/2017   ID: Hannah Estes, DOB 1925/09/06, MRN [id number]  PCP: System, Pcp Not In  Primary Cardiologist: Nona Dell, MD   Chief Complaint  Patient presents with  . Atrial Fibrillation    History of Present Illness: Hannah Estes is an 84 y.o. female last seen in May 2018.  She presents for a routine follow-up visit today.  She tells me that she has had no major change in stamina, continues to dance 4 days a week, reports no chest pain or palpitations.  Also no falls or bleeding problems on Eliquis.  I personally reviewed her ECG today which shows rate controlled atrial fibrillation with nonspecific T wave changes.  I reviewed her medications.  Cardiac regimen includes Eliquis, Cardizem CD, lisinopril, and Lopressor.  She has not required any nitroglycerin use.  Past Medical History:  Diagnosis Date  . Carotid artery disease (HCC)   . COPD (chronic obstructive pulmonary disease) (HCC)   . Coronary artery disease    Multivessel, LVEF 65%  . GERD (gastroesophageal reflux disease)   . Hyperlipidemia   . Myocardial infarction [facility])    NSTEMI 6/09    Past Surgical History:  Procedure Laterality Date  . CATARACT EXTRACTION W/PHACO  03/05/2012   Procedure: CATARACT EXTRACTION PHACO AND INTRAOCULAR LENS PLACEMENT (IOC);  Surgeon: Gemma Payor, MD;  Location: [facility];  Service: Ophthalmology;  Laterality: Left;  CDE 25.71  . CATARACT EXTRACTION W/PHACO  03/19/2012   Procedure: CATARACT EXTRACTION PHACO AND INTRAOCULAR LENS PLACEMENT (IOC);  Surgeon: Gemma Payor, MD;  Location: [facility];  Service: Ophthalmology;  Laterality: Right;  CDE:24.94  . CORONARY ARTERY BYPASS GRAFT  2009   Off pump LIMA to LAD, SVG to first obtuse marginal , SVG to PDA      Current Outpatient Medications  Medication Sig Dispense Refill  . apixaban (ELIQUIS) 5 MG TABS tablet TAKE (1) TABLET TWICE DAILY. 60 tablet 0  . diltiazem (CARDIZEM CD) 180 MG 24 hr capsule Take  1 capsule (180 mg total) by mouth daily. 90 capsule 1  . lisinopril (PRINIVIL,ZESTRIL) 10 MG tablet Take 10 mg by mouth daily.    . metoprolol tartrate (LOPRESSOR) 25 MG tablet Take 12.5 mg by mouth 2 (two) times daily.    . Multiple Vitamins-Minerals (MULTIVITAMIN WITH MINERALS) tablet Take 1 tablet by mouth every other day.     . nitroGLYCERIN (NITROSTAT) 0.4 MG SL tablet Place 0.4 mg under the tongue every 5 (five) minutes as needed. Chest pain.    . Omega-3 Fatty Acids (FISH OIL) 1200 MG CAPS Take 2 capsules by mouth 2 (two) times daily. OTC medication, 1200 mg capsule, only delivers 900 mg of fish oil, called a mini capsule     No current facility-administered medications for this visit.    Facility-Administered Medications Ordered in Other Visits  Medication Dose Route Frequency Provider Last Rate Last Dose  . neomycin-polymyxin-dexameth (MAXITROL) 0.1 % ophth ointment    PRN Gemma Payor, MD   1 application at 03/05/12 4098   Allergies:  Levaquin [levofloxacin in d5w] and Penicillins   Social History: The patient  reports that she quit smoking about 20 years ago. Her smoking use included cigarettes. She has a 16.00 pack-year smoking history. she has never used smokeless tobacco. She reports that she does not drink alcohol or use drugs.   ROS:  Please see the history of present illness. Otherwise, complete review of systems is positive for hearing and memory  loss.  All other systems are reviewed and negative.   Physical Exam: VS:  BP 102/64   Pulse 91   Ht 5\' 5"  (1.651 m)   Wt 146 lb (66.2 kg)   SpO2 96%   BMI 24.30 kg/m , BMI Body mass index is 24.3 kg/m.  Wt Readings from Last 3 Encounters:  05/31/17 146 lb (66.2 kg)  08/16/16 148 lb 6.4 oz (67.3 kg)  05/17/16 147 lb 3.2 oz (66.8 kg)    General: Elderly woman, appears comfortable at rest. HEENT: Conjunctiva and lids normal, oropharynx clear. Neck: Supple, no elevated JVP or carotid bruits, no thyromegaly. Lungs: No  wheezing, nonlabored breathing at rest. Cardiac: Irregularly irregular, no S3 or significant systolic murmur. Abdomen: Soft, nontender, bowel sounds present, no guarding or rebound. Extremities: No pitting edema, distal pulses 2+. Skin: Warm and dry. Musculoskeletal: No kyphosis. Neuropsychiatric: Alert and oriented x3, affect grossly appropriate.  ECG: I personally reviewed the tracing from 03/14/2016 which showed atrial fibrillation with PVC versus aberrantly conducted complex and nonspecific ST changes.  Recent Labwork:  May 2018: BUN 8, creatinine 0.64, potassium 4.4, hemoglobin 14.8, platelets 261  Other Studies Reviewed Today:  Echocardiogram 04/06/2016: Study Conclusions  - Left ventricle: The cavity size was normal. Wall thickness was   increased in a pattern of mild LVH. Systolic function was mildly   reduced. The estimated ejection fraction was in the range of 45%   to 50%. There is hypokinesis of the apicalanteroseptal   myocardium. There is hypokinesis of the midinferolateral   myocardium. The study is not technically sufficient to allow   evaluation of LV diastolic function. - Aortic valve: Mildly to moderately calcified annulus. Trileaflet;   mildly calcified leaflets. - Mitral valve: Calcified annulus. Mildly thickened leaflets .   There was mild to moderate regurgitation. - Left atrium: The atrium was mildly dilated. - Atrial septum: No defect or patent foramen ovale was identified. - Tricuspid valve: There was mild regurgitation. - Pulmonary arteries: PA peak pressure: 22 mm Hg (S). - Pericardium, extracardiac: There was no pericardial effusion.  Impressions:  - Mild LVH with LVEF 45-50%. Mild hypokinesis of the apical   anteroseptal and mid inferolateral walls noted. Indeterminate   diastolic function in the setting of atrial fibrillation. Mild   left atrial enlargement. Mildly calcified mitral annulus with   mildly thickened leaflets and mild to moderate  mitral   regurgitation. Aortic valve sclerosis without stenosis. Mild   tricuspid regurgitation with PASP estimated 22 mmHg.  Assessment and Plan:  1.  Chronic atrial fibrillation.  Continue strategy of heart rate control and anticoagulation.  She is tolerating Eliquis.  I reviewed her last lab work.  No reported bleeding problems.  2.  Secondary cardiomyopathy with LVEF 45-50%.  Weight is stable, no leg swelling or obvious fluid overload.  Currently not requiring diuretic.  3.  Multivessel CAD status post CABG in 2009.  We are continue with medical therapy and observation in the absence of progressive angina symptoms.  4.  Mixed hyperlipidemia with statin intolerance.  Current medicines were reviewed with the patient today.   Orders Placed This Encounter  Procedures  . EKG 12-Lead    Disposition: Follow-up in 6 months.  Signed, Jonelle SidleSamuel G. Panayiotis Rain[place], MD, [facility] 05/31/2017 11:39 AM    [facility] [address], [place], [place] [address] Phone: [phone]) [phone]; Fax: [phone]

## 2017-05-31 ENCOUNTER — Ambulatory Visit (INDEPENDENT_AMBULATORY_CARE_PROVIDER_SITE_OTHER): Payer: Medicare Other | Admitting: Cardiology

## 2017-05-31 ENCOUNTER — Encounter: Payer: Self-pay | Admitting: Cardiology

## 2017-05-31 VITALS — BP 102/64 | HR 91 | Ht 65.0 in | Wt 146.0 lb

## 2017-05-31 DIAGNOSIS — I482 Chronic atrial fibrillation, unspecified: Secondary | ICD-10-CM

## 2017-05-31 DIAGNOSIS — I429 Cardiomyopathy, unspecified: Secondary | ICD-10-CM

## 2017-05-31 DIAGNOSIS — I2510 Atherosclerotic heart disease of native coronary artery without angina pectoris: Secondary | ICD-10-CM | POA: Diagnosis not present

## 2017-05-31 DIAGNOSIS — E782 Mixed hyperlipidemia: Secondary | ICD-10-CM

## 2017-05-31 NOTE — Patient Instructions (Signed)

## 2017-06-19 ENCOUNTER — Other Ambulatory Visit: Payer: Self-pay | Admitting: Cardiology

## 2017-06-23 ENCOUNTER — Other Ambulatory Visit: Payer: Self-pay | Admitting: Cardiology

## 2017-06-23 MED ORDER — METOPROLOL TARTRATE 25 MG PO TABS: 12.5000 mg | ORAL_TABLET | Freq: Two times a day (BID) | ORAL | 3 refills | Status: DC

## 2017-06-23 NOTE — Telephone Encounter (Signed)
Patient walk in   [facility] Sherryll BurgerShah will not refill due to being out of busy   *STAT* If patient is at the pharmacy, call can be transferred to refill team.   1. Which medications need to be refilled?   metoprolol tartrate (LOPRESSOR) 25 MG tablet    2. Which pharmacy/location (including street and city if local pharmacy) is medication to be sent to? [name] [place]   3. Do they need a 30 day or 90 day supply?

## 2017-07-18 DIAGNOSIS — R42 Dizziness and giddiness: Secondary | ICD-10-CM | POA: Diagnosis not present

## 2017-07-18 DIAGNOSIS — I959 Hypotension, unspecified: Secondary | ICD-10-CM | POA: Diagnosis not present

## 2017-07-18 DIAGNOSIS — I10 Essential (primary) hypertension: Secondary | ICD-10-CM | POA: Diagnosis not present

## 2017-07-18 DIAGNOSIS — I482 Chronic atrial fibrillation: Secondary | ICD-10-CM | POA: Diagnosis not present

## 2017-07-18 DIAGNOSIS — N390 Urinary tract infection, site not specified: Secondary | ICD-10-CM | POA: Diagnosis not present

## 2017-07-18 DIAGNOSIS — K449 Diaphragmatic hernia without obstruction or gangrene: Secondary | ICD-10-CM | POA: Diagnosis not present

## 2017-07-18 DIAGNOSIS — I252 Old myocardial infarction: Secondary | ICD-10-CM | POA: Diagnosis not present

## 2017-07-18 DIAGNOSIS — R55 Syncope and collapse: Secondary | ICD-10-CM | POA: Diagnosis not present

## 2017-07-18 DIAGNOSIS — J449 Chronic obstructive pulmonary disease, unspecified: Secondary | ICD-10-CM | POA: Diagnosis not present

## 2017-07-18 DIAGNOSIS — N3001 Acute cystitis with hematuria: Secondary | ICD-10-CM | POA: Diagnosis not present

## 2017-07-19 DIAGNOSIS — I10 Essential (primary) hypertension: Secondary | ICD-10-CM | POA: Diagnosis present

## 2017-07-19 DIAGNOSIS — D72829 Elevated white blood cell count, unspecified: Secondary | ICD-10-CM | POA: Diagnosis not present

## 2017-07-19 DIAGNOSIS — N390 Urinary tract infection, site not specified: Secondary | ICD-10-CM | POA: Diagnosis present

## 2017-07-19 DIAGNOSIS — I4891 Unspecified atrial fibrillation: Secondary | ICD-10-CM | POA: Diagnosis not present

## 2017-07-19 DIAGNOSIS — Z87891 Personal history of nicotine dependence: Secondary | ICD-10-CM | POA: Diagnosis not present

## 2017-07-19 DIAGNOSIS — Z880 Allergy status to penicillin: Secondary | ICD-10-CM | POA: Diagnosis not present

## 2017-07-19 DIAGNOSIS — R55 Syncope and collapse: Secondary | ICD-10-CM | POA: Diagnosis present

## 2017-07-19 DIAGNOSIS — Z79899 Other long term (current) drug therapy: Secondary | ICD-10-CM | POA: Diagnosis not present

## 2017-07-19 DIAGNOSIS — Z7982 Long term (current) use of aspirin: Secondary | ICD-10-CM | POA: Diagnosis not present

## 2017-07-19 DIAGNOSIS — Z951 Presence of aortocoronary bypass graft: Secondary | ICD-10-CM | POA: Diagnosis not present

## 2017-07-19 DIAGNOSIS — Z7901 Long term (current) use of anticoagulants: Secondary | ICD-10-CM | POA: Diagnosis not present

## 2017-07-19 DIAGNOSIS — I252 Old myocardial infarction: Secondary | ICD-10-CM | POA: Diagnosis not present

## 2017-07-19 DIAGNOSIS — J449 Chronic obstructive pulmonary disease, unspecified: Secondary | ICD-10-CM | POA: Diagnosis present

## 2017-07-19 DIAGNOSIS — I482 Chronic atrial fibrillation: Secondary | ICD-10-CM | POA: Diagnosis present

## 2017-07-28 DIAGNOSIS — I10 Essential (primary) hypertension: Secondary | ICD-10-CM | POA: Diagnosis not present

## 2017-07-28 DIAGNOSIS — G3184 Mild cognitive impairment, so stated: Secondary | ICD-10-CM | POA: Diagnosis not present

## 2017-07-28 DIAGNOSIS — I480 Paroxysmal atrial fibrillation: Secondary | ICD-10-CM | POA: Diagnosis not present

## 2017-07-28 DIAGNOSIS — N390 Urinary tract infection, site not specified: Secondary | ICD-10-CM | POA: Diagnosis not present

## 2017-07-28 DIAGNOSIS — J4520 Mild intermittent asthma, uncomplicated: Secondary | ICD-10-CM | POA: Diagnosis not present

## 2017-07-28 DIAGNOSIS — Z6827 Body mass index (BMI) 27.0-27.9, adult: Secondary | ICD-10-CM | POA: Diagnosis not present

## 2017-08-14 ENCOUNTER — Ambulatory Visit: Payer: Medicare Other | Admitting: Physician Assistant

## 2017-09-23 DIAGNOSIS — R001 Bradycardia, unspecified: Secondary | ICD-10-CM | POA: Diagnosis present

## 2017-09-23 DIAGNOSIS — N289 Disorder of kidney and ureter, unspecified: Secondary | ICD-10-CM | POA: Diagnosis present

## 2017-09-23 DIAGNOSIS — I10 Essential (primary) hypertension: Secondary | ICD-10-CM | POA: Diagnosis not present

## 2017-09-23 DIAGNOSIS — J449 Chronic obstructive pulmonary disease, unspecified: Secondary | ICD-10-CM | POA: Diagnosis present

## 2017-09-23 DIAGNOSIS — Z7982 Long term (current) use of aspirin: Secondary | ICD-10-CM | POA: Diagnosis not present

## 2017-09-23 DIAGNOSIS — Z951 Presence of aortocoronary bypass graft: Secondary | ICD-10-CM | POA: Diagnosis not present

## 2017-09-23 DIAGNOSIS — T447X1A Poisoning by beta-adrenoreceptor antagonists, accidental (unintentional), initial encounter: Secondary | ICD-10-CM | POA: Diagnosis present

## 2017-09-23 DIAGNOSIS — Z880 Allergy status to penicillin: Secondary | ICD-10-CM | POA: Diagnosis not present

## 2017-09-23 DIAGNOSIS — I952 Hypotension due to drugs: Secondary | ICD-10-CM | POA: Diagnosis not present

## 2017-09-23 DIAGNOSIS — I959 Hypotension, unspecified: Secondary | ICD-10-CM | POA: Diagnosis not present

## 2017-09-23 DIAGNOSIS — Z87891 Personal history of nicotine dependence: Secondary | ICD-10-CM | POA: Diagnosis not present

## 2017-09-23 DIAGNOSIS — I672 Cerebral atherosclerosis: Secondary | ICD-10-CM | POA: Diagnosis not present

## 2017-09-23 DIAGNOSIS — N390 Urinary tract infection, site not specified: Secondary | ICD-10-CM | POA: Diagnosis present

## 2017-09-23 DIAGNOSIS — I252 Old myocardial infarction: Secondary | ICD-10-CM | POA: Diagnosis not present

## 2017-09-23 DIAGNOSIS — I482 Chronic atrial fibrillation: Secondary | ICD-10-CM | POA: Diagnosis present

## 2017-09-23 DIAGNOSIS — Z7901 Long term (current) use of anticoagulants: Secondary | ICD-10-CM | POA: Diagnosis not present

## 2017-09-23 DIAGNOSIS — R55 Syncope and collapse: Secondary | ICD-10-CM | POA: Diagnosis not present

## 2017-09-23 DIAGNOSIS — Z79899 Other long term (current) drug therapy: Secondary | ICD-10-CM | POA: Diagnosis not present

## 2017-09-28 NOTE — Progress Notes (Deleted)
Cardiology Office Note  Date: 09/28/2017   ID: Hannah Estes, DOB Sep 16, 1925, MRN [id number]  PCP: Roosvelt MaserPappas, Susan D, NP  Primary Cardiologist: Nona DellSamuel Crystalina Stodghill, MD   No chief complaint on file.   History of Present Illness: Hannah Estes is an 84 y.o. female last seen in March.  Record review finds that she was seen to establish care with PCP in May following hospitalization with UTI and altered mental status.  There were questions at that visit about the patient's memory deficits, baseline mental status, as well as her ability to continue driving.  Past Medical History:  Diagnosis Date  . Carotid artery disease (HCC)   . COPD (chronic obstructive pulmonary disease) (HCC)   . Coronary artery disease    Multivessel, LVEF 65%  . GERD (gastroesophageal reflux disease)   . Hyperlipidemia   . Myocardial infarction [facility](HCC)    NSTEMI 6/09    Past Surgical History:  Procedure Laterality Date  . CATARACT EXTRACTION W/PHACO  03/05/2012   Procedure: CATARACT EXTRACTION PHACO AND INTRAOCULAR LENS PLACEMENT (IOC);  Surgeon: Gemma PayorKerry Hunt, MD;  Location: [facility];  Service: Ophthalmology;  Laterality: Left;  CDE 25.71  . CATARACT EXTRACTION W/PHACO  03/19/2012   Procedure: CATARACT EXTRACTION PHACO AND INTRAOCULAR LENS PLACEMENT (IOC);  Surgeon: Gemma PayorKerry Hunt, MD;  Location: [facility];  Service: Ophthalmology;  Laterality: Right;  CDE:24.94  . CORONARY ARTERY BYPASS GRAFT  2009   Off pump LIMA to LAD, SVG to first obtuse marginal , SVG to PDA      Current Outpatient Medications  Medication Sig Dispense Refill  . diltiazem (CARDIZEM CD) 180 MG 24 hr capsule Take 1 capsule (180 mg total) by mouth daily. 90 capsule 1  . ELIQUIS 5 MG TABS tablet TAKE (1) TABLET TWICE DAILY. 60 tablet 6  . lisinopril (PRINIVIL,ZESTRIL) 10 MG tablet Take 10 mg by mouth daily.    . metoprolol tartrate (LOPRESSOR) 25 MG tablet Take 0.5 tablets (12.5 mg total) by mouth 2 (two) times daily. 90 tablet 3  . Multiple  Vitamins-Minerals (MULTIVITAMIN WITH MINERALS) tablet Take 1 tablet by mouth every other day.     . nitroGLYCERIN (NITROSTAT) 0.4 MG SL tablet Place 0.4 mg under the tongue every 5 (five) minutes as needed. Chest pain.    . Omega-3 Fatty Acids (FISH OIL) 1200 MG CAPS Take 2 capsules by mouth 2 (two) times daily. OTC medication, 1200 mg capsule, only delivers 900 mg of fish oil, called a mini capsule     No current facility-administered medications for this visit.    Facility-Administered Medications Ordered in Other Visits  Medication Dose Route Frequency Provider Last Rate Last Dose  . neomycin-polymyxin-dexameth (MAXITROL) 0.1 % ophth ointment    PRN Gemma PayorHunt, Kerry, MD   1 application at 03/05/12 [id number]   Allergies:  Levaquin [levofloxacin in d5w] and Penicillins   Social History: The patient  reports that she quit smoking about 20 years ago. Her smoking use included cigarettes. She has a 16.00 pack-year smoking history. She has never used smokeless tobacco. She reports that she does not drink alcohol or use drugs.   Family History: The patient's family history is not on file.   ROS:  Please see the history of present illness. Otherwise, complete review of systems is positive for {NONE DEFAULTED:18576::"none"}.  All other systems are reviewed and negative.   Physical Exam: VS:  There were no vitals taken for this visit., BMI There is no height or weight on file to  calculate BMI.  Wt Readings from Last 3 Encounters:  05/31/17 146 lb (66.2 kg)  08/16/16 148 lb 6.4 oz (67.3 kg)  05/17/16 147 lb 3.2 oz (66.8 kg)    General: Patient appears comfortable at rest. HEENT: Conjunctiva and lids normal, oropharynx clear with moist mucosa. Neck: Supple, no elevated JVP or carotid bruits, no thyromegaly. Lungs: Clear to auscultation, nonlabored breathing at rest. Cardiac: Regular rate and rhythm, no S3 or significant systolic murmur, no pericardial rub. Abdomen: Soft, nontender, no hepatomegaly, bowel  sounds present, no guarding or rebound. Extremities: No pitting edema, distal pulses 2+. Skin: Warm and dry. Musculoskeletal: No kyphosis. Neuropsychiatric: Alert and oriented x3, affect grossly appropriate.  ECG: I personally reviewed the tracing from 05/31/2017 which showed rate controlled atrial fibrillation with nonspecific T wave changes.  Recent Labwork:  May 2018: BUN 8, creatinine 0.64, potassium 4.4, hemoglobin 14.8, platelets 261  Other Studies Reviewed Today:  Echocardiogram 04/06/2016: Study Conclusions  - Left ventricle: The cavity size was normal. Wall thickness was increased in a pattern of mild LVH. Systolic function was mildly reduced. The estimated ejection fraction was in the range of 45% to 50%. There is hypokinesis of the apicalanteroseptal myocardium. There is hypokinesis of the midinferolateral myocardium. The study is not technically sufficient to allow evaluation of LV diastolic function. - Aortic valve: Mildly to moderately calcified annulus. Trileaflet; mildly calcified leaflets. - Mitral valve: Calcified annulus. Mildly thickened leaflets . There was mild to moderate regurgitation. - Left atrium: The atrium was mildly dilated. - Atrial septum: No defect or patent foramen ovale was identified. - Tricuspid valve: There was mild regurgitation. - Pulmonary arteries: PA peak pressure: 22 mm Hg (S). - Pericardium, extracardiac: There was no pericardial effusion.  Impressions:  - Mild LVH with LVEF 45-50%. Mild hypokinesis of the apical anteroseptal and mid inferolateral walls noted. Indeterminate diastolic function in the setting of atrial fibrillation. Mild left atrial enlargement. Mildly calcified mitral annulus with mildly thickened leaflets and mild to moderate mitral regurgitation. Aortic valve sclerosis without stenosis. Mild tricuspid regurgitation with PASP estimated 22 mmHg.  Assessment and Plan:    Current  medicines were reviewed with the patient today.  No orders of the defined types were placed in this encounter.   Disposition:  Signed, Jonelle Sidle, MD, [facility] 09/28/2017 6:58 PM    [facility] at [facility] [address] [place], [place], [place] [address] Phone: [phone]; Fax: [phone]

## 2017-10-02 ENCOUNTER — Ambulatory Visit: Payer: Medicare Other | Admitting: Cardiology

## 2017-10-27 ENCOUNTER — Other Ambulatory Visit: Payer: Self-pay | Admitting: Cardiology

## 2017-11-23 DIAGNOSIS — Z8679 Personal history of other diseases of the circulatory system: Secondary | ICD-10-CM | POA: Diagnosis not present

## 2017-11-23 DIAGNOSIS — I480 Paroxysmal atrial fibrillation: Secondary | ICD-10-CM | POA: Diagnosis not present

## 2017-11-23 DIAGNOSIS — Z1329 Encounter for screening for other suspected endocrine disorder: Secondary | ICD-10-CM | POA: Diagnosis not present

## 2017-11-23 DIAGNOSIS — I252 Old myocardial infarction: Secondary | ICD-10-CM | POA: Diagnosis not present

## 2017-11-23 DIAGNOSIS — N390 Urinary tract infection, site not specified: Secondary | ICD-10-CM | POA: Diagnosis not present

## 2017-11-23 DIAGNOSIS — R7302 Impaired glucose tolerance (oral): Secondary | ICD-10-CM | POA: Diagnosis not present

## 2017-11-23 DIAGNOSIS — G3184 Mild cognitive impairment, so stated: Secondary | ICD-10-CM | POA: Diagnosis not present

## 2017-11-23 DIAGNOSIS — Z6826 Body mass index (BMI) 26.0-26.9, adult: Secondary | ICD-10-CM | POA: Diagnosis not present

## 2017-11-29 DIAGNOSIS — R4182 Altered mental status, unspecified: Secondary | ICD-10-CM | POA: Diagnosis not present

## 2017-11-29 DIAGNOSIS — Z041 Encounter for examination and observation following transport accident: Secondary | ICD-10-CM | POA: Diagnosis not present

## 2017-11-29 DIAGNOSIS — R410 Disorientation, unspecified: Secondary | ICD-10-CM | POA: Diagnosis not present

## 2017-11-29 DIAGNOSIS — R404 Transient alteration of awareness: Secondary | ICD-10-CM | POA: Diagnosis not present

## 2017-12-28 ENCOUNTER — Other Ambulatory Visit: Payer: Self-pay | Admitting: Cardiology

## 2018-01-04 ENCOUNTER — Other Ambulatory Visit: Payer: Self-pay | Admitting: Cardiovascular Disease

## 2018-01-04 DIAGNOSIS — E039 Hypothyroidism, unspecified: Secondary | ICD-10-CM | POA: Diagnosis not present

## 2018-01-05 ENCOUNTER — Other Ambulatory Visit: Payer: Self-pay | Admitting: Cardiovascular Disease

## 2018-01-09 DIAGNOSIS — Z6826 Body mass index (BMI) 26.0-26.9, adult: Secondary | ICD-10-CM | POA: Diagnosis not present

## 2018-01-09 DIAGNOSIS — Z299 Encounter for prophylactic measures, unspecified: Secondary | ICD-10-CM | POA: Diagnosis not present

## 2018-01-09 DIAGNOSIS — I10 Essential (primary) hypertension: Secondary | ICD-10-CM | POA: Diagnosis not present

## 2018-01-09 DIAGNOSIS — Z713 Dietary counseling and surveillance: Secondary | ICD-10-CM | POA: Diagnosis not present

## 2018-01-09 DIAGNOSIS — I4891 Unspecified atrial fibrillation: Secondary | ICD-10-CM | POA: Diagnosis not present

## 2018-01-31 ENCOUNTER — Other Ambulatory Visit: Payer: Self-pay | Admitting: Cardiovascular Disease

## 2018-01-31 ENCOUNTER — Other Ambulatory Visit: Payer: Self-pay | Admitting: Cardiology

## 2018-02-12 DIAGNOSIS — I10 Essential (primary) hypertension: Secondary | ICD-10-CM | POA: Diagnosis not present

## 2018-02-12 DIAGNOSIS — Z6827 Body mass index (BMI) 27.0-27.9, adult: Secondary | ICD-10-CM | POA: Diagnosis not present

## 2018-02-12 DIAGNOSIS — E039 Hypothyroidism, unspecified: Secondary | ICD-10-CM | POA: Diagnosis not present

## 2018-02-12 DIAGNOSIS — Z299 Encounter for prophylactic measures, unspecified: Secondary | ICD-10-CM | POA: Diagnosis not present

## 2018-02-12 DIAGNOSIS — I4891 Unspecified atrial fibrillation: Secondary | ICD-10-CM | POA: Diagnosis not present

## 2018-02-16 ENCOUNTER — Ambulatory Visit (INDEPENDENT_AMBULATORY_CARE_PROVIDER_SITE_OTHER): Payer: Medicare Other | Admitting: Cardiology

## 2018-02-16 ENCOUNTER — Encounter: Payer: Self-pay | Admitting: Cardiology

## 2018-02-16 VITALS — BP 100/58 | HR 74 | Ht 65.0 in | Wt 144.4 lb

## 2018-02-16 DIAGNOSIS — I2510 Atherosclerotic heart disease of native coronary artery without angina pectoris: Secondary | ICD-10-CM | POA: Diagnosis not present

## 2018-02-16 DIAGNOSIS — I429 Cardiomyopathy, unspecified: Secondary | ICD-10-CM | POA: Diagnosis not present

## 2018-02-16 DIAGNOSIS — Z79899 Other long term (current) drug therapy: Secondary | ICD-10-CM

## 2018-02-16 DIAGNOSIS — I4821 Permanent atrial fibrillation: Secondary | ICD-10-CM | POA: Diagnosis not present

## 2018-02-16 DIAGNOSIS — E782 Mixed hyperlipidemia: Secondary | ICD-10-CM | POA: Diagnosis not present

## 2018-02-16 NOTE — Patient Instructions (Addendum)
Medication Instructions:   Your physician recommends that you continue on your current medications as directed. Please refer to the Current Medication list given to you today.  Labwork:  Your physician recommends that you return for lab work today to check your BMET & CBC.  Testing/Procedures:  NONE  Follow-Up:  Your physician recommends that you schedule a follow-up appointment in: 6 months. You will receive a reminder letter in the mail in about 4 months reminding you to call and schedule your appointment. If you don't receive this letter, please contact our office.  Any Other Special Instructions Will Be Listed Below (If Applicable).  If you need a refill on your cardiac medications before your next appointment, please call your pharmacy.

## 2018-02-16 NOTE — Progress Notes (Signed)
Cardiology Office Note  Date: 02/16/2018   ID: Hannah Estes, DOB 04/11/1925, MRN [id number]  PCP: Kirstie Peri, MD  Primary Cardiologist: Nona Dell, MD    Chief Complaint  Patient presents with  . Atrial Fibrillation    History of Present Illness: Hannah Estes is an 84 y.o. female last seen in March.  She is here today with a caseworker for follow-up.  She states that she still goes dancing 4 days a week.  She denies any specific chest pain or palpitations.  She has had no obvious falls or bleeding problems.  She follows with Dr. Sherryll Burger her primary care.  She has not had follow-up lab work recently on Eliquis, we discussed obtaining a CBC and BMET.  Current cardiac medications include Eliquis, Cardizem CD, lisinopril, and Lopressor.  Past Medical History:  Diagnosis Date  . Carotid artery disease (HCC)   . COPD (chronic obstructive pulmonary disease) (HCC)   . Coronary artery disease    Multivessel, LVEF 65%  . GERD (gastroesophageal reflux disease)   . Hyperlipidemia   . Myocardial infarction [facility])    NSTEMI 6/09    Past Surgical History:  Procedure Laterality Date  . CATARACT EXTRACTION W/PHACO  03/05/2012   Procedure: CATARACT EXTRACTION PHACO AND INTRAOCULAR LENS PLACEMENT (IOC);  Surgeon: Gemma Payor, MD;  Location: [facility];  Service: Ophthalmology;  Laterality: Left;  CDE 25.71  . CATARACT EXTRACTION W/PHACO  03/19/2012   Procedure: CATARACT EXTRACTION PHACO AND INTRAOCULAR LENS PLACEMENT (IOC);  Surgeon: Gemma Payor, MD;  Location: [facility];  Service: Ophthalmology;  Laterality: Right;  CDE:24.94  . CORONARY ARTERY BYPASS GRAFT  2009   Off pump LIMA to LAD, SVG to first obtuse marginal , SVG to PDA      Current Outpatient Medications  Medication Sig Dispense Refill  . diltiazem (CARDIZEM CD) 180 MG 24 hr capsule TAKE 1 CAPSULE BY MOUTH ONCE DAILY. 90 capsule 0  . ELIQUIS 5 MG TABS tablet TAKE 1 TABLET BY MOUTH TWICE DAILY. 56 tablet 11  . lisinopril  (PRINIVIL,ZESTRIL) 10 MG tablet Take 10 mg by mouth daily.    . metoprolol tartrate (LOPRESSOR) 25 MG tablet Take 0.5 tablets (12.5 mg total) by mouth 2 (two) times daily. 90 tablet 3  . Multiple Vitamins-Minerals (MULTIVITAMIN WITH MINERALS) tablet Take 1 tablet by mouth every other day.     . nitroGLYCERIN (NITROSTAT) 0.4 MG SL tablet Place 0.4 mg under the tongue every 5 (five) minutes as needed. Chest pain.    . Omega-3 Fatty Acids (FISH OIL) 1200 MG CAPS Take 2 capsules by mouth 2 (two) times daily. OTC medication, 1200 mg capsule, only delivers 900 mg of fish oil, called a mini capsule     No current facility-administered medications for this visit.    Facility-Administered Medications Ordered in Other Visits  Medication Dose Route Frequency Provider Last Rate Last Dose  . neomycin-polymyxin-dexameth (MAXITROL) 0.1 % ophth ointment    PRN Gemma Payor, MD   1 application at 03/05/12 4034   Allergies:  Levaquin [levofloxacin in d5w] and Penicillins   Social History: The patient  reports that she quit smoking about 20 years ago. Her smoking use included cigarettes. She has a 16.00 pack-year smoking history. She has never used smokeless tobacco. She reports that she does not drink alcohol or use drugs.   ROS:  Please see the history of present illness. Otherwise, complete review of systems is positive for hearing and memory loss..  All other  systems are reviewed and negative.   Physical Exam: VS:  BP (!) 100/58   Pulse 74   Ht 5\' 5"  (1.651 m)   Wt 144 lb 6.4 oz (65.5 kg)   SpO2 96%   BMI 24.03 kg/m , BMI Body mass index is 24.03 kg/m.  Wt Readings from Last 3 Encounters:  02/16/18 144 lb 6.4 oz (65.5 kg)  05/31/17 146 lb (66.2 kg)  08/16/16 148 lb 6.4 oz (67.3 kg)    General: Elderly woman, appears comfortable at rest. HEENT: Conjunctiva and lids normal, oropharynx clear. Neck: Supple, no elevated JVP or carotid bruits, no thyromegaly. Lungs: Clear to auscultation, nonlabored  breathing at rest. Cardiac: Irregularly irregular, no S3 or significant systolic murmur. Abdomen: Soft, nontender, bowel sounds present. Extremities: No pitting edema, distal pulses 2+. Skin: Warm and dry. Musculoskeletal: No kyphosis. Neuropsychiatric: Hearing loss evident, affect grossly appropriate.  ECG: I personally reviewed the tracing from 05/31/2017 which showed rate controlled atrial fibrillation with nonspecific T wave changes.  Recent Labwork:  May 2018: BUN 8, creatinine 0.64, potassium 4.4, hemoglobin 14.8, platelets 261  Other Studies Reviewed Today:  Echocardiogram 04/06/2016: Study Conclusions  - Left ventricle: The cavity size was normal. Wall thickness was increased in a pattern of mild LVH. Systolic function was mildly reduced. The estimated ejection fraction was in the range of 45% to 50%. There is hypokinesis of the apicalanteroseptal myocardium. There is hypokinesis of the midinferolateral myocardium. The study is not technically sufficient to allow evaluation of LV diastolic function. - Aortic valve: Mildly to moderately calcified annulus. Trileaflet; mildly calcified leaflets. - Mitral valve: Calcified annulus. Mildly thickened leaflets . There was mild to moderate regurgitation. - Left atrium: The atrium was mildly dilated. - Atrial septum: No defect or patent foramen ovale was identified. - Tricuspid valve: There was mild regurgitation. - Pulmonary arteries: PA peak pressure: 22 mm Hg (S). - Pericardium, extracardiac: There was no pericardial effusion.  Impressions:  - Mild LVH with LVEF 45-50%. Mild hypokinesis of the apical anteroseptal and mid inferolateral walls noted. Indeterminate diastolic function in the setting of atrial fibrillation. Mild left atrial enlargement. Mildly calcified mitral annulus with mildly thickened leaflets and mild to moderate mitral regurgitation. Aortic valve sclerosis without stenosis.  Mild tricuspid regurgitation with PASP estimated 22 mmHg.  Assessment and Plan:  1.  Permanent atrial fibrillation.  Plan to continue Eliquis for stroke prophylaxis, heart rate is adequately controlled on current dose of Lopressor.  We will obtain a follow-up CBC and BMET.  2.  Secondary cardiomyopathy with mildly reduced LVEF at 45 to 50%.  Weight is stable and she has no evidence of fluid overload on examination.  3.  Multivessel CAD status post CABG in 2009.  Continue conservative management.  4.  Mixed hyperlipidemia with history of statin intolerance.  Current medicines were reviewed with the patient today.   Orders Placed This Encounter  Procedures  . Basic metabolic panel  . CBC    Disposition: Follow-up in 6 months.  Signed, Jonelle SidleSamuel G. Darvis Croft, MD, [facility] CenterFACC 02/16/2018 2:46 PM    [facility] [place] [address], [place], [place] [address] Phone: [phone]; Fax: [phone]

## 2018-02-19 ENCOUNTER — Telehealth: Payer: Self-pay | Admitting: *Deleted

## 2018-02-19 NOTE — Telephone Encounter (Signed)
-----   Message from Jonelle SidleSamuel G McDowell, MD sent at 02/19/2018  8:21 AM EST ----- Results reviewed.  Renal function and potassium are normal.  Hemoglobin and platelet count are normal.  Continue with current medications. A copy of this test should be forwarded to Kirstie PeriShah, Ashish, MD.

## 2018-02-19 NOTE — Telephone Encounter (Signed)
Patient informed. Copy sent to PCP °

## 2018-03-23 DIAGNOSIS — Z299 Encounter for prophylactic measures, unspecified: Secondary | ICD-10-CM | POA: Diagnosis not present

## 2018-03-23 DIAGNOSIS — Z6827 Body mass index (BMI) 27.0-27.9, adult: Secondary | ICD-10-CM | POA: Diagnosis not present

## 2018-03-23 DIAGNOSIS — I4891 Unspecified atrial fibrillation: Secondary | ICD-10-CM | POA: Diagnosis not present

## 2018-03-23 DIAGNOSIS — I10 Essential (primary) hypertension: Secondary | ICD-10-CM | POA: Diagnosis not present

## 2018-06-07 ENCOUNTER — Other Ambulatory Visit: Payer: Self-pay | Admitting: Cardiology

## 2018-06-22 DIAGNOSIS — Z79899 Other long term (current) drug therapy: Secondary | ICD-10-CM | POA: Diagnosis not present

## 2018-06-22 DIAGNOSIS — Z0000 Encounter for general adult medical examination without abnormal findings: Secondary | ICD-10-CM | POA: Diagnosis not present

## 2018-06-22 DIAGNOSIS — E559 Vitamin D deficiency, unspecified: Secondary | ICD-10-CM | POA: Diagnosis not present

## 2018-06-22 DIAGNOSIS — Z7189 Other specified counseling: Secondary | ICD-10-CM | POA: Diagnosis not present

## 2018-06-22 DIAGNOSIS — E039 Hypothyroidism, unspecified: Secondary | ICD-10-CM | POA: Diagnosis not present

## 2018-06-22 DIAGNOSIS — Z1339 Encounter for screening examination for other mental health and behavioral disorders: Secondary | ICD-10-CM | POA: Diagnosis not present

## 2018-06-22 DIAGNOSIS — I10 Essential (primary) hypertension: Secondary | ICD-10-CM | POA: Diagnosis not present

## 2018-06-22 DIAGNOSIS — Z1211 Encounter for screening for malignant neoplasm of colon: Secondary | ICD-10-CM | POA: Diagnosis not present

## 2018-06-22 DIAGNOSIS — Z1331 Encounter for screening for depression: Secondary | ICD-10-CM | POA: Diagnosis not present

## 2018-06-22 DIAGNOSIS — Z6825 Body mass index (BMI) 25.0-25.9, adult: Secondary | ICD-10-CM | POA: Diagnosis not present

## 2018-06-22 DIAGNOSIS — Z299 Encounter for prophylactic measures, unspecified: Secondary | ICD-10-CM | POA: Diagnosis not present

## 2018-09-14 DIAGNOSIS — E2839 Other primary ovarian failure: Secondary | ICD-10-CM | POA: Diagnosis not present

## 2018-10-02 DIAGNOSIS — Z789 Other specified health status: Secondary | ICD-10-CM | POA: Diagnosis not present

## 2018-10-02 DIAGNOSIS — Z299 Encounter for prophylactic measures, unspecified: Secondary | ICD-10-CM | POA: Diagnosis not present

## 2018-10-02 DIAGNOSIS — I10 Essential (primary) hypertension: Secondary | ICD-10-CM | POA: Diagnosis not present

## 2018-10-02 DIAGNOSIS — R413 Other amnesia: Secondary | ICD-10-CM | POA: Diagnosis not present

## 2018-10-02 DIAGNOSIS — Z6829 Body mass index (BMI) 29.0-29.9, adult: Secondary | ICD-10-CM | POA: Diagnosis not present

## 2018-10-02 DIAGNOSIS — E039 Hypothyroidism, unspecified: Secondary | ICD-10-CM | POA: Diagnosis not present

## 2018-10-02 DIAGNOSIS — I4891 Unspecified atrial fibrillation: Secondary | ICD-10-CM | POA: Diagnosis not present

## 2018-12-04 DIAGNOSIS — Z299 Encounter for prophylactic measures, unspecified: Secondary | ICD-10-CM | POA: Diagnosis not present

## 2018-12-04 DIAGNOSIS — Z6829 Body mass index (BMI) 29.0-29.9, adult: Secondary | ICD-10-CM | POA: Diagnosis not present

## 2018-12-04 DIAGNOSIS — E039 Hypothyroidism, unspecified: Secondary | ICD-10-CM | POA: Diagnosis not present

## 2018-12-04 DIAGNOSIS — I10 Essential (primary) hypertension: Secondary | ICD-10-CM | POA: Diagnosis not present

## 2018-12-04 DIAGNOSIS — I4891 Unspecified atrial fibrillation: Secondary | ICD-10-CM | POA: Diagnosis not present

## 2019-04-16 DIAGNOSIS — Z299 Encounter for prophylactic measures, unspecified: Secondary | ICD-10-CM | POA: Diagnosis not present

## 2019-04-16 DIAGNOSIS — Z789 Other specified health status: Secondary | ICD-10-CM | POA: Diagnosis not present

## 2019-04-16 DIAGNOSIS — I4891 Unspecified atrial fibrillation: Secondary | ICD-10-CM | POA: Diagnosis not present

## 2019-04-16 DIAGNOSIS — I10 Essential (primary) hypertension: Secondary | ICD-10-CM | POA: Diagnosis not present

## 2019-04-16 DIAGNOSIS — Z6829 Body mass index (BMI) 29.0-29.9, adult: Secondary | ICD-10-CM | POA: Diagnosis not present

## 2019-04-16 DIAGNOSIS — Z713 Dietary counseling and surveillance: Secondary | ICD-10-CM | POA: Diagnosis not present

## 2019-04-26 ENCOUNTER — Other Ambulatory Visit: Payer: Self-pay | Admitting: Cardiology

## 2019-09-18 DIAGNOSIS — E559 Vitamin D deficiency, unspecified: Secondary | ICD-10-CM | POA: Diagnosis not present

## 2019-09-18 DIAGNOSIS — Z79899 Other long term (current) drug therapy: Secondary | ICD-10-CM | POA: Diagnosis not present

## 2019-09-18 DIAGNOSIS — R5383 Other fatigue: Secondary | ICD-10-CM | POA: Diagnosis not present

## 2019-09-18 DIAGNOSIS — E039 Hypothyroidism, unspecified: Secondary | ICD-10-CM | POA: Diagnosis not present

## 2020-01-17 DIAGNOSIS — M25521 Pain in right elbow: Secondary | ICD-10-CM | POA: Diagnosis not present

## 2020-01-17 DIAGNOSIS — Z299 Encounter for prophylactic measures, unspecified: Secondary | ICD-10-CM | POA: Diagnosis not present

## 2020-01-17 DIAGNOSIS — G4700 Insomnia, unspecified: Secondary | ICD-10-CM | POA: Diagnosis not present

## 2020-01-17 DIAGNOSIS — M7989 Other specified soft tissue disorders: Secondary | ICD-10-CM | POA: Diagnosis not present

## 2020-01-17 DIAGNOSIS — G301 Alzheimer's disease with late onset: Secondary | ICD-10-CM | POA: Diagnosis not present

## 2020-01-17 DIAGNOSIS — M24821 Other specific joint derangements of right elbow, not elsewhere classified: Secondary | ICD-10-CM | POA: Diagnosis not present

## 2020-01-17 DIAGNOSIS — I10 Essential (primary) hypertension: Secondary | ICD-10-CM | POA: Diagnosis not present

## 2020-01-17 DIAGNOSIS — M19021 Primary osteoarthritis, right elbow: Secondary | ICD-10-CM | POA: Diagnosis not present

## 2020-04-04 DIAGNOSIS — I700 Atherosclerosis of aorta: Secondary | ICD-10-CM | POA: Diagnosis not present

## 2020-04-04 DIAGNOSIS — I517 Cardiomegaly: Secondary | ICD-10-CM | POA: Diagnosis not present

## 2020-04-04 DIAGNOSIS — I2510 Atherosclerotic heart disease of native coronary artery without angina pectoris: Secondary | ICD-10-CM | POA: Diagnosis present

## 2020-04-04 DIAGNOSIS — Z7951 Long term (current) use of inhaled steroids: Secondary | ICD-10-CM | POA: Diagnosis not present

## 2020-04-04 DIAGNOSIS — R0689 Other abnormalities of breathing: Secondary | ICD-10-CM | POA: Diagnosis not present

## 2020-04-04 DIAGNOSIS — K529 Noninfective gastroenteritis and colitis, unspecified: Secondary | ICD-10-CM | POA: Diagnosis present

## 2020-04-04 DIAGNOSIS — K8689 Other specified diseases of pancreas: Secondary | ICD-10-CM | POA: Diagnosis not present

## 2020-04-04 DIAGNOSIS — Z880 Allergy status to penicillin: Secondary | ICD-10-CM | POA: Diagnosis not present

## 2020-04-04 DIAGNOSIS — Z20822 Contact with and (suspected) exposure to covid-19: Secondary | ICD-10-CM | POA: Diagnosis present

## 2020-04-04 DIAGNOSIS — R2689 Other abnormalities of gait and mobility: Secondary | ICD-10-CM | POA: Diagnosis not present

## 2020-04-04 DIAGNOSIS — E860 Dehydration: Secondary | ICD-10-CM | POA: Diagnosis not present

## 2020-04-04 DIAGNOSIS — R6520 Severe sepsis without septic shock: Secondary | ICD-10-CM | POA: Diagnosis present

## 2020-04-04 DIAGNOSIS — D72829 Elevated white blood cell count, unspecified: Secondary | ICD-10-CM | POA: Diagnosis not present

## 2020-04-04 DIAGNOSIS — Z7901 Long term (current) use of anticoagulants: Secondary | ICD-10-CM | POA: Diagnosis not present

## 2020-04-04 DIAGNOSIS — K449 Diaphragmatic hernia without obstruction or gangrene: Secondary | ICD-10-CM | POA: Diagnosis not present

## 2020-04-04 DIAGNOSIS — M6281 Muscle weakness (generalized): Secondary | ICD-10-CM | POA: Diagnosis not present

## 2020-04-04 DIAGNOSIS — N289 Disorder of kidney and ureter, unspecified: Secondary | ICD-10-CM | POA: Diagnosis not present

## 2020-04-04 DIAGNOSIS — R7989 Other specified abnormal findings of blood chemistry: Secondary | ICD-10-CM | POA: Diagnosis not present

## 2020-04-04 DIAGNOSIS — K5790 Diverticulosis of intestine, part unspecified, without perforation or abscess without bleeding: Secondary | ICD-10-CM | POA: Diagnosis not present

## 2020-04-04 DIAGNOSIS — E785 Hyperlipidemia, unspecified: Secondary | ICD-10-CM | POA: Diagnosis present

## 2020-04-04 DIAGNOSIS — I480 Paroxysmal atrial fibrillation: Secondary | ICD-10-CM | POA: Diagnosis present

## 2020-04-04 DIAGNOSIS — R001 Bradycardia, unspecified: Secondary | ICD-10-CM | POA: Diagnosis not present

## 2020-04-04 DIAGNOSIS — E872 Acidosis: Secondary | ICD-10-CM | POA: Diagnosis not present

## 2020-04-04 DIAGNOSIS — N390 Urinary tract infection, site not specified: Secondary | ICD-10-CM | POA: Diagnosis not present

## 2020-04-04 DIAGNOSIS — N179 Acute kidney failure, unspecified: Secondary | ICD-10-CM | POA: Diagnosis not present

## 2020-04-04 DIAGNOSIS — I10 Essential (primary) hypertension: Secondary | ICD-10-CM | POA: Diagnosis present

## 2020-04-04 DIAGNOSIS — E039 Hypothyroidism, unspecified: Secondary | ICD-10-CM | POA: Diagnosis present

## 2020-04-04 DIAGNOSIS — J45909 Unspecified asthma, uncomplicated: Secondary | ICD-10-CM | POA: Diagnosis present

## 2020-04-04 DIAGNOSIS — R197 Diarrhea, unspecified: Secondary | ICD-10-CM | POA: Diagnosis not present

## 2020-04-04 DIAGNOSIS — N3000 Acute cystitis without hematuria: Secondary | ICD-10-CM | POA: Diagnosis not present

## 2020-04-04 DIAGNOSIS — Z79890 Hormone replacement therapy (postmenopausal): Secondary | ICD-10-CM | POA: Diagnosis not present

## 2020-04-04 DIAGNOSIS — R0902 Hypoxemia: Secondary | ICD-10-CM | POA: Diagnosis present

## 2020-04-04 DIAGNOSIS — R41841 Cognitive communication deficit: Secondary | ICD-10-CM | POA: Diagnosis not present

## 2020-04-04 DIAGNOSIS — R4182 Altered mental status, unspecified: Secondary | ICD-10-CM | POA: Diagnosis not present

## 2020-04-04 DIAGNOSIS — A419 Sepsis, unspecified organism: Secondary | ICD-10-CM | POA: Diagnosis not present

## 2020-04-09 DIAGNOSIS — G301 Alzheimer's disease with late onset: Secondary | ICD-10-CM | POA: Diagnosis not present

## 2020-04-09 DIAGNOSIS — K219 Gastro-esophageal reflux disease without esophagitis: Secondary | ICD-10-CM | POA: Diagnosis not present

## 2020-04-09 DIAGNOSIS — N390 Urinary tract infection, site not specified: Secondary | ICD-10-CM | POA: Diagnosis not present

## 2020-04-09 DIAGNOSIS — R7989 Other specified abnormal findings of blood chemistry: Secondary | ICD-10-CM | POA: Diagnosis not present

## 2020-04-09 DIAGNOSIS — Z299 Encounter for prophylactic measures, unspecified: Secondary | ICD-10-CM | POA: Diagnosis not present

## 2020-04-09 DIAGNOSIS — E039 Hypothyroidism, unspecified: Secondary | ICD-10-CM | POA: Diagnosis not present

## 2020-04-09 DIAGNOSIS — I480 Paroxysmal atrial fibrillation: Secondary | ICD-10-CM | POA: Diagnosis not present

## 2020-04-09 DIAGNOSIS — N289 Disorder of kidney and ureter, unspecified: Secondary | ICD-10-CM | POA: Diagnosis not present

## 2020-04-09 DIAGNOSIS — Z7901 Long term (current) use of anticoagulants: Secondary | ICD-10-CM | POA: Diagnosis not present

## 2020-04-09 DIAGNOSIS — R2689 Other abnormalities of gait and mobility: Secondary | ICD-10-CM | POA: Diagnosis not present

## 2020-04-09 DIAGNOSIS — R531 Weakness: Secondary | ICD-10-CM | POA: Diagnosis not present

## 2020-04-09 DIAGNOSIS — R197 Diarrhea, unspecified: Secondary | ICD-10-CM | POA: Diagnosis not present

## 2020-04-09 DIAGNOSIS — I10 Essential (primary) hypertension: Secondary | ICD-10-CM | POA: Diagnosis not present

## 2020-04-09 DIAGNOSIS — E785 Hyperlipidemia, unspecified: Secondary | ICD-10-CM | POA: Diagnosis not present

## 2020-04-09 DIAGNOSIS — N179 Acute kidney failure, unspecified: Secondary | ICD-10-CM | POA: Diagnosis not present

## 2020-04-09 DIAGNOSIS — I2510 Atherosclerotic heart disease of native coronary artery without angina pectoris: Secondary | ICD-10-CM | POA: Diagnosis not present

## 2020-04-09 DIAGNOSIS — R112 Nausea with vomiting, unspecified: Secondary | ICD-10-CM | POA: Diagnosis not present

## 2020-04-09 DIAGNOSIS — E860 Dehydration: Secondary | ICD-10-CM | POA: Diagnosis not present

## 2020-04-09 DIAGNOSIS — I4891 Unspecified atrial fibrillation: Secondary | ICD-10-CM | POA: Diagnosis not present

## 2020-04-09 DIAGNOSIS — F0281 Dementia in other diseases classified elsewhere with behavioral disturbance: Secondary | ICD-10-CM | POA: Diagnosis not present

## 2020-04-09 DIAGNOSIS — M6281 Muscle weakness (generalized): Secondary | ICD-10-CM | POA: Diagnosis not present

## 2020-04-09 DIAGNOSIS — R41841 Cognitive communication deficit: Secondary | ICD-10-CM | POA: Diagnosis not present

## 2020-04-09 DIAGNOSIS — J45909 Unspecified asthma, uncomplicated: Secondary | ICD-10-CM | POA: Diagnosis not present

## 2020-04-23 DIAGNOSIS — I4891 Unspecified atrial fibrillation: Secondary | ICD-10-CM | POA: Diagnosis not present

## 2020-04-23 DIAGNOSIS — I10 Essential (primary) hypertension: Secondary | ICD-10-CM | POA: Diagnosis not present

## 2020-04-23 DIAGNOSIS — F0281 Dementia in other diseases classified elsewhere with behavioral disturbance: Secondary | ICD-10-CM | POA: Diagnosis not present

## 2020-04-23 DIAGNOSIS — Z299 Encounter for prophylactic measures, unspecified: Secondary | ICD-10-CM | POA: Diagnosis not present

## 2020-04-23 DIAGNOSIS — G301 Alzheimer's disease with late onset: Secondary | ICD-10-CM | POA: Diagnosis not present

## 2020-04-24 DIAGNOSIS — K219 Gastro-esophageal reflux disease without esophagitis: Secondary | ICD-10-CM | POA: Diagnosis not present

## 2020-04-24 DIAGNOSIS — R112 Nausea with vomiting, unspecified: Secondary | ICD-10-CM | POA: Diagnosis not present

## 2020-04-24 DIAGNOSIS — I10 Essential (primary) hypertension: Secondary | ICD-10-CM | POA: Diagnosis not present

## 2020-04-24 DIAGNOSIS — R531 Weakness: Secondary | ICD-10-CM | POA: Diagnosis not present

## 2020-04-24 DIAGNOSIS — Z299 Encounter for prophylactic measures, unspecified: Secondary | ICD-10-CM | POA: Diagnosis not present

## 2020-04-25 DIAGNOSIS — J45909 Unspecified asthma, uncomplicated: Secondary | ICD-10-CM | POA: Diagnosis not present

## 2020-04-25 DIAGNOSIS — R197 Diarrhea, unspecified: Secondary | ICD-10-CM | POA: Diagnosis not present

## 2020-04-25 DIAGNOSIS — N390 Urinary tract infection, site not specified: Secondary | ICD-10-CM | POA: Diagnosis not present

## 2020-04-25 DIAGNOSIS — I10 Essential (primary) hypertension: Secondary | ICD-10-CM | POA: Diagnosis not present

## 2020-04-25 DIAGNOSIS — I480 Paroxysmal atrial fibrillation: Secondary | ICD-10-CM | POA: Diagnosis not present

## 2020-04-25 DIAGNOSIS — N289 Disorder of kidney and ureter, unspecified: Secondary | ICD-10-CM | POA: Diagnosis not present

## 2020-04-25 DIAGNOSIS — Z7901 Long term (current) use of anticoagulants: Secondary | ICD-10-CM | POA: Diagnosis not present

## 2020-04-27 DIAGNOSIS — N289 Disorder of kidney and ureter, unspecified: Secondary | ICD-10-CM | POA: Diagnosis not present

## 2020-04-27 DIAGNOSIS — I480 Paroxysmal atrial fibrillation: Secondary | ICD-10-CM | POA: Diagnosis not present

## 2020-04-27 DIAGNOSIS — R197 Diarrhea, unspecified: Secondary | ICD-10-CM | POA: Diagnosis not present

## 2020-04-27 DIAGNOSIS — N390 Urinary tract infection, site not specified: Secondary | ICD-10-CM | POA: Diagnosis not present

## 2020-04-27 DIAGNOSIS — J45909 Unspecified asthma, uncomplicated: Secondary | ICD-10-CM | POA: Diagnosis not present

## 2020-04-27 DIAGNOSIS — I10 Essential (primary) hypertension: Secondary | ICD-10-CM | POA: Diagnosis not present

## 2020-04-29 DIAGNOSIS — R197 Diarrhea, unspecified: Secondary | ICD-10-CM | POA: Diagnosis not present

## 2020-04-29 DIAGNOSIS — I10 Essential (primary) hypertension: Secondary | ICD-10-CM | POA: Diagnosis not present

## 2020-04-29 DIAGNOSIS — N390 Urinary tract infection, site not specified: Secondary | ICD-10-CM | POA: Diagnosis not present

## 2020-04-29 DIAGNOSIS — J45909 Unspecified asthma, uncomplicated: Secondary | ICD-10-CM | POA: Diagnosis not present

## 2020-04-29 DIAGNOSIS — I480 Paroxysmal atrial fibrillation: Secondary | ICD-10-CM | POA: Diagnosis not present

## 2020-04-29 DIAGNOSIS — N289 Disorder of kidney and ureter, unspecified: Secondary | ICD-10-CM | POA: Diagnosis not present

## 2020-04-30 DIAGNOSIS — R197 Diarrhea, unspecified: Secondary | ICD-10-CM | POA: Diagnosis not present

## 2020-04-30 DIAGNOSIS — N289 Disorder of kidney and ureter, unspecified: Secondary | ICD-10-CM | POA: Diagnosis not present

## 2020-04-30 DIAGNOSIS — I480 Paroxysmal atrial fibrillation: Secondary | ICD-10-CM | POA: Diagnosis not present

## 2020-04-30 DIAGNOSIS — J45909 Unspecified asthma, uncomplicated: Secondary | ICD-10-CM | POA: Diagnosis not present

## 2020-04-30 DIAGNOSIS — I10 Essential (primary) hypertension: Secondary | ICD-10-CM | POA: Diagnosis not present

## 2020-04-30 DIAGNOSIS — N390 Urinary tract infection, site not specified: Secondary | ICD-10-CM | POA: Diagnosis not present

## 2020-05-04 DIAGNOSIS — R197 Diarrhea, unspecified: Secondary | ICD-10-CM | POA: Diagnosis not present

## 2020-05-04 DIAGNOSIS — I10 Essential (primary) hypertension: Secondary | ICD-10-CM | POA: Diagnosis not present

## 2020-05-04 DIAGNOSIS — I480 Paroxysmal atrial fibrillation: Secondary | ICD-10-CM | POA: Diagnosis not present

## 2020-05-04 DIAGNOSIS — J45909 Unspecified asthma, uncomplicated: Secondary | ICD-10-CM | POA: Diagnosis not present

## 2020-05-04 DIAGNOSIS — N390 Urinary tract infection, site not specified: Secondary | ICD-10-CM | POA: Diagnosis not present

## 2020-05-04 DIAGNOSIS — N289 Disorder of kidney and ureter, unspecified: Secondary | ICD-10-CM | POA: Diagnosis not present

## 2020-05-05 DIAGNOSIS — J45909 Unspecified asthma, uncomplicated: Secondary | ICD-10-CM | POA: Diagnosis not present

## 2020-05-05 DIAGNOSIS — N390 Urinary tract infection, site not specified: Secondary | ICD-10-CM | POA: Diagnosis not present

## 2020-05-05 DIAGNOSIS — N289 Disorder of kidney and ureter, unspecified: Secondary | ICD-10-CM | POA: Diagnosis not present

## 2020-05-05 DIAGNOSIS — R197 Diarrhea, unspecified: Secondary | ICD-10-CM | POA: Diagnosis not present

## 2020-05-05 DIAGNOSIS — I480 Paroxysmal atrial fibrillation: Secondary | ICD-10-CM | POA: Diagnosis not present

## 2020-05-05 DIAGNOSIS — I10 Essential (primary) hypertension: Secondary | ICD-10-CM | POA: Diagnosis not present

## 2020-05-06 DIAGNOSIS — N390 Urinary tract infection, site not specified: Secondary | ICD-10-CM | POA: Diagnosis not present

## 2020-05-06 DIAGNOSIS — I480 Paroxysmal atrial fibrillation: Secondary | ICD-10-CM | POA: Diagnosis not present

## 2020-05-06 DIAGNOSIS — I10 Essential (primary) hypertension: Secondary | ICD-10-CM | POA: Diagnosis not present

## 2020-05-06 DIAGNOSIS — J45909 Unspecified asthma, uncomplicated: Secondary | ICD-10-CM | POA: Diagnosis not present

## 2020-05-06 DIAGNOSIS — R197 Diarrhea, unspecified: Secondary | ICD-10-CM | POA: Diagnosis not present

## 2020-05-06 DIAGNOSIS — N289 Disorder of kidney and ureter, unspecified: Secondary | ICD-10-CM | POA: Diagnosis not present

## 2020-05-07 DIAGNOSIS — I480 Paroxysmal atrial fibrillation: Secondary | ICD-10-CM | POA: Diagnosis not present

## 2020-05-07 DIAGNOSIS — I10 Essential (primary) hypertension: Secondary | ICD-10-CM | POA: Diagnosis not present

## 2020-05-07 DIAGNOSIS — R197 Diarrhea, unspecified: Secondary | ICD-10-CM | POA: Diagnosis not present

## 2020-05-07 DIAGNOSIS — J45909 Unspecified asthma, uncomplicated: Secondary | ICD-10-CM | POA: Diagnosis not present

## 2020-05-07 DIAGNOSIS — N390 Urinary tract infection, site not specified: Secondary | ICD-10-CM | POA: Diagnosis not present

## 2020-05-07 DIAGNOSIS — N289 Disorder of kidney and ureter, unspecified: Secondary | ICD-10-CM | POA: Diagnosis not present

## 2020-05-11 DIAGNOSIS — J45909 Unspecified asthma, uncomplicated: Secondary | ICD-10-CM | POA: Diagnosis not present

## 2020-05-11 DIAGNOSIS — N390 Urinary tract infection, site not specified: Secondary | ICD-10-CM | POA: Diagnosis not present

## 2020-05-11 DIAGNOSIS — I10 Essential (primary) hypertension: Secondary | ICD-10-CM | POA: Diagnosis not present

## 2020-05-11 DIAGNOSIS — N289 Disorder of kidney and ureter, unspecified: Secondary | ICD-10-CM | POA: Diagnosis not present

## 2020-05-11 DIAGNOSIS — R197 Diarrhea, unspecified: Secondary | ICD-10-CM | POA: Diagnosis not present

## 2020-05-11 DIAGNOSIS — I480 Paroxysmal atrial fibrillation: Secondary | ICD-10-CM | POA: Diagnosis not present

## 2020-05-12 DIAGNOSIS — N390 Urinary tract infection, site not specified: Secondary | ICD-10-CM | POA: Diagnosis not present

## 2020-05-12 DIAGNOSIS — J45909 Unspecified asthma, uncomplicated: Secondary | ICD-10-CM | POA: Diagnosis not present

## 2020-05-12 DIAGNOSIS — I480 Paroxysmal atrial fibrillation: Secondary | ICD-10-CM | POA: Diagnosis not present

## 2020-05-12 DIAGNOSIS — R197 Diarrhea, unspecified: Secondary | ICD-10-CM | POA: Diagnosis not present

## 2020-05-12 DIAGNOSIS — N289 Disorder of kidney and ureter, unspecified: Secondary | ICD-10-CM | POA: Diagnosis not present

## 2020-05-12 DIAGNOSIS — I10 Essential (primary) hypertension: Secondary | ICD-10-CM | POA: Diagnosis not present

## 2020-05-13 DIAGNOSIS — N289 Disorder of kidney and ureter, unspecified: Secondary | ICD-10-CM | POA: Diagnosis not present

## 2020-05-13 DIAGNOSIS — J45909 Unspecified asthma, uncomplicated: Secondary | ICD-10-CM | POA: Diagnosis not present

## 2020-05-13 DIAGNOSIS — N390 Urinary tract infection, site not specified: Secondary | ICD-10-CM | POA: Diagnosis not present

## 2020-05-13 DIAGNOSIS — I10 Essential (primary) hypertension: Secondary | ICD-10-CM | POA: Diagnosis not present

## 2020-05-13 DIAGNOSIS — I480 Paroxysmal atrial fibrillation: Secondary | ICD-10-CM | POA: Diagnosis not present

## 2020-05-13 DIAGNOSIS — R197 Diarrhea, unspecified: Secondary | ICD-10-CM | POA: Diagnosis not present

## 2020-05-14 DIAGNOSIS — Z1331 Encounter for screening for depression: Secondary | ICD-10-CM | POA: Diagnosis not present

## 2020-05-14 DIAGNOSIS — I4891 Unspecified atrial fibrillation: Secondary | ICD-10-CM | POA: Diagnosis not present

## 2020-05-14 DIAGNOSIS — J45909 Unspecified asthma, uncomplicated: Secondary | ICD-10-CM | POA: Diagnosis not present

## 2020-05-14 DIAGNOSIS — Z789 Other specified health status: Secondary | ICD-10-CM | POA: Diagnosis not present

## 2020-05-14 DIAGNOSIS — G301 Alzheimer's disease with late onset: Secondary | ICD-10-CM | POA: Diagnosis not present

## 2020-05-14 DIAGNOSIS — Z299 Encounter for prophylactic measures, unspecified: Secondary | ICD-10-CM | POA: Diagnosis not present

## 2020-05-14 DIAGNOSIS — E1165 Type 2 diabetes mellitus with hyperglycemia: Secondary | ICD-10-CM | POA: Diagnosis not present

## 2020-05-14 DIAGNOSIS — I10 Essential (primary) hypertension: Secondary | ICD-10-CM | POA: Diagnosis not present

## 2020-05-14 DIAGNOSIS — N390 Urinary tract infection, site not specified: Secondary | ICD-10-CM | POA: Diagnosis not present

## 2020-05-14 DIAGNOSIS — G4700 Insomnia, unspecified: Secondary | ICD-10-CM | POA: Diagnosis not present

## 2020-05-14 DIAGNOSIS — N289 Disorder of kidney and ureter, unspecified: Secondary | ICD-10-CM | POA: Diagnosis not present

## 2020-05-14 DIAGNOSIS — Z6829 Body mass index (BMI) 29.0-29.9, adult: Secondary | ICD-10-CM | POA: Diagnosis not present

## 2020-05-14 DIAGNOSIS — R197 Diarrhea, unspecified: Secondary | ICD-10-CM | POA: Diagnosis not present

## 2020-05-14 DIAGNOSIS — I480 Paroxysmal atrial fibrillation: Secondary | ICD-10-CM | POA: Diagnosis not present

## 2020-05-15 DIAGNOSIS — N289 Disorder of kidney and ureter, unspecified: Secondary | ICD-10-CM | POA: Diagnosis not present

## 2020-05-15 DIAGNOSIS — J45909 Unspecified asthma, uncomplicated: Secondary | ICD-10-CM | POA: Diagnosis not present

## 2020-05-15 DIAGNOSIS — I10 Essential (primary) hypertension: Secondary | ICD-10-CM | POA: Diagnosis not present

## 2020-05-15 DIAGNOSIS — R197 Diarrhea, unspecified: Secondary | ICD-10-CM | POA: Diagnosis not present

## 2020-05-15 DIAGNOSIS — I480 Paroxysmal atrial fibrillation: Secondary | ICD-10-CM | POA: Diagnosis not present

## 2020-05-15 DIAGNOSIS — N390 Urinary tract infection, site not specified: Secondary | ICD-10-CM | POA: Diagnosis not present

## 2020-05-18 DIAGNOSIS — J45909 Unspecified asthma, uncomplicated: Secondary | ICD-10-CM | POA: Diagnosis not present

## 2020-05-18 DIAGNOSIS — R197 Diarrhea, unspecified: Secondary | ICD-10-CM | POA: Diagnosis not present

## 2020-05-18 DIAGNOSIS — N390 Urinary tract infection, site not specified: Secondary | ICD-10-CM | POA: Diagnosis not present

## 2020-05-18 DIAGNOSIS — N289 Disorder of kidney and ureter, unspecified: Secondary | ICD-10-CM | POA: Diagnosis not present

## 2020-05-18 DIAGNOSIS — I10 Essential (primary) hypertension: Secondary | ICD-10-CM | POA: Diagnosis not present

## 2020-05-18 DIAGNOSIS — I480 Paroxysmal atrial fibrillation: Secondary | ICD-10-CM | POA: Diagnosis not present

## 2020-05-20 DIAGNOSIS — N289 Disorder of kidney and ureter, unspecified: Secondary | ICD-10-CM | POA: Diagnosis not present

## 2020-05-20 DIAGNOSIS — J45909 Unspecified asthma, uncomplicated: Secondary | ICD-10-CM | POA: Diagnosis not present

## 2020-05-20 DIAGNOSIS — I10 Essential (primary) hypertension: Secondary | ICD-10-CM | POA: Diagnosis not present

## 2020-05-20 DIAGNOSIS — R197 Diarrhea, unspecified: Secondary | ICD-10-CM | POA: Diagnosis not present

## 2020-05-20 DIAGNOSIS — N390 Urinary tract infection, site not specified: Secondary | ICD-10-CM | POA: Diagnosis not present

## 2020-05-20 DIAGNOSIS — I480 Paroxysmal atrial fibrillation: Secondary | ICD-10-CM | POA: Diagnosis not present

## 2020-05-21 DIAGNOSIS — I10 Essential (primary) hypertension: Secondary | ICD-10-CM | POA: Diagnosis not present

## 2020-05-21 DIAGNOSIS — R197 Diarrhea, unspecified: Secondary | ICD-10-CM | POA: Diagnosis not present

## 2020-05-21 DIAGNOSIS — I480 Paroxysmal atrial fibrillation: Secondary | ICD-10-CM | POA: Diagnosis not present

## 2020-05-21 DIAGNOSIS — N390 Urinary tract infection, site not specified: Secondary | ICD-10-CM | POA: Diagnosis not present

## 2020-05-21 DIAGNOSIS — J45909 Unspecified asthma, uncomplicated: Secondary | ICD-10-CM | POA: Diagnosis not present

## 2020-05-21 DIAGNOSIS — N289 Disorder of kidney and ureter, unspecified: Secondary | ICD-10-CM | POA: Diagnosis not present

## 2020-05-25 DIAGNOSIS — J45909 Unspecified asthma, uncomplicated: Secondary | ICD-10-CM | POA: Diagnosis not present

## 2020-05-25 DIAGNOSIS — R197 Diarrhea, unspecified: Secondary | ICD-10-CM | POA: Diagnosis not present

## 2020-05-25 DIAGNOSIS — N289 Disorder of kidney and ureter, unspecified: Secondary | ICD-10-CM | POA: Diagnosis not present

## 2020-05-25 DIAGNOSIS — I10 Essential (primary) hypertension: Secondary | ICD-10-CM | POA: Diagnosis not present

## 2020-05-25 DIAGNOSIS — I480 Paroxysmal atrial fibrillation: Secondary | ICD-10-CM | POA: Diagnosis not present

## 2020-05-25 DIAGNOSIS — N390 Urinary tract infection, site not specified: Secondary | ICD-10-CM | POA: Diagnosis not present

## 2020-05-25 DIAGNOSIS — Z7901 Long term (current) use of anticoagulants: Secondary | ICD-10-CM | POA: Diagnosis not present

## 2020-05-27 DIAGNOSIS — J45909 Unspecified asthma, uncomplicated: Secondary | ICD-10-CM | POA: Diagnosis not present

## 2020-05-27 DIAGNOSIS — R2681 Unsteadiness on feet: Secondary | ICD-10-CM | POA: Diagnosis not present

## 2020-05-27 DIAGNOSIS — E039 Hypothyroidism, unspecified: Secondary | ICD-10-CM | POA: Diagnosis not present

## 2020-05-27 DIAGNOSIS — N289 Disorder of kidney and ureter, unspecified: Secondary | ICD-10-CM | POA: Diagnosis not present

## 2020-05-27 DIAGNOSIS — Z299 Encounter for prophylactic measures, unspecified: Secondary | ICD-10-CM | POA: Diagnosis not present

## 2020-05-27 DIAGNOSIS — R197 Diarrhea, unspecified: Secondary | ICD-10-CM | POA: Diagnosis not present

## 2020-05-27 DIAGNOSIS — I4891 Unspecified atrial fibrillation: Secondary | ICD-10-CM | POA: Diagnosis not present

## 2020-05-27 DIAGNOSIS — I480 Paroxysmal atrial fibrillation: Secondary | ICD-10-CM | POA: Diagnosis not present

## 2020-05-27 DIAGNOSIS — I10 Essential (primary) hypertension: Secondary | ICD-10-CM | POA: Diagnosis not present

## 2020-05-27 DIAGNOSIS — N390 Urinary tract infection, site not specified: Secondary | ICD-10-CM | POA: Diagnosis not present

## 2020-05-28 DIAGNOSIS — Z299 Encounter for prophylactic measures, unspecified: Secondary | ICD-10-CM | POA: Diagnosis not present

## 2020-05-28 DIAGNOSIS — Z6829 Body mass index (BMI) 29.0-29.9, adult: Secondary | ICD-10-CM | POA: Diagnosis not present

## 2020-05-28 DIAGNOSIS — I4891 Unspecified atrial fibrillation: Secondary | ICD-10-CM | POA: Diagnosis not present

## 2020-05-28 DIAGNOSIS — Z789 Other specified health status: Secondary | ICD-10-CM | POA: Diagnosis not present

## 2020-05-28 DIAGNOSIS — R21 Rash and other nonspecific skin eruption: Secondary | ICD-10-CM | POA: Diagnosis not present

## 2020-05-28 DIAGNOSIS — R531 Weakness: Secondary | ICD-10-CM | POA: Diagnosis not present

## 2020-05-28 DIAGNOSIS — I10 Essential (primary) hypertension: Secondary | ICD-10-CM | POA: Diagnosis not present

## 2020-06-01 DIAGNOSIS — I10 Essential (primary) hypertension: Secondary | ICD-10-CM | POA: Diagnosis not present

## 2020-06-01 DIAGNOSIS — J45909 Unspecified asthma, uncomplicated: Secondary | ICD-10-CM | POA: Diagnosis not present

## 2020-06-01 DIAGNOSIS — R197 Diarrhea, unspecified: Secondary | ICD-10-CM | POA: Diagnosis not present

## 2020-06-01 DIAGNOSIS — N390 Urinary tract infection, site not specified: Secondary | ICD-10-CM | POA: Diagnosis not present

## 2020-06-01 DIAGNOSIS — N289 Disorder of kidney and ureter, unspecified: Secondary | ICD-10-CM | POA: Diagnosis not present

## 2020-06-01 DIAGNOSIS — I480 Paroxysmal atrial fibrillation: Secondary | ICD-10-CM | POA: Diagnosis not present

## 2020-06-04 DIAGNOSIS — I480 Paroxysmal atrial fibrillation: Secondary | ICD-10-CM | POA: Diagnosis not present

## 2020-06-04 DIAGNOSIS — I10 Essential (primary) hypertension: Secondary | ICD-10-CM | POA: Diagnosis not present

## 2020-06-04 DIAGNOSIS — N289 Disorder of kidney and ureter, unspecified: Secondary | ICD-10-CM | POA: Diagnosis not present

## 2020-06-04 DIAGNOSIS — R197 Diarrhea, unspecified: Secondary | ICD-10-CM | POA: Diagnosis not present

## 2020-06-04 DIAGNOSIS — J45909 Unspecified asthma, uncomplicated: Secondary | ICD-10-CM | POA: Diagnosis not present

## 2020-06-04 DIAGNOSIS — N390 Urinary tract infection, site not specified: Secondary | ICD-10-CM | POA: Diagnosis not present

## 2020-06-10 DIAGNOSIS — J45909 Unspecified asthma, uncomplicated: Secondary | ICD-10-CM | POA: Diagnosis not present

## 2020-06-10 DIAGNOSIS — R197 Diarrhea, unspecified: Secondary | ICD-10-CM | POA: Diagnosis not present

## 2020-06-10 DIAGNOSIS — I10 Essential (primary) hypertension: Secondary | ICD-10-CM | POA: Diagnosis not present

## 2020-06-10 DIAGNOSIS — N390 Urinary tract infection, site not specified: Secondary | ICD-10-CM | POA: Diagnosis not present

## 2020-06-10 DIAGNOSIS — N289 Disorder of kidney and ureter, unspecified: Secondary | ICD-10-CM | POA: Diagnosis not present

## 2020-06-10 DIAGNOSIS — I480 Paroxysmal atrial fibrillation: Secondary | ICD-10-CM | POA: Diagnosis not present

## 2020-06-17 DIAGNOSIS — N289 Disorder of kidney and ureter, unspecified: Secondary | ICD-10-CM | POA: Diagnosis not present

## 2020-06-17 DIAGNOSIS — J45909 Unspecified asthma, uncomplicated: Secondary | ICD-10-CM | POA: Diagnosis not present

## 2020-06-17 DIAGNOSIS — N390 Urinary tract infection, site not specified: Secondary | ICD-10-CM | POA: Diagnosis not present

## 2020-06-17 DIAGNOSIS — I10 Essential (primary) hypertension: Secondary | ICD-10-CM | POA: Diagnosis not present

## 2020-06-17 DIAGNOSIS — I480 Paroxysmal atrial fibrillation: Secondary | ICD-10-CM | POA: Diagnosis not present

## 2020-06-17 DIAGNOSIS — R197 Diarrhea, unspecified: Secondary | ICD-10-CM | POA: Diagnosis not present

## 2020-07-09 DIAGNOSIS — Z299 Encounter for prophylactic measures, unspecified: Secondary | ICD-10-CM | POA: Diagnosis not present

## 2020-07-09 DIAGNOSIS — R21 Rash and other nonspecific skin eruption: Secondary | ICD-10-CM | POA: Diagnosis not present

## 2020-07-09 DIAGNOSIS — I10 Essential (primary) hypertension: Secondary | ICD-10-CM | POA: Diagnosis not present

## 2020-07-09 DIAGNOSIS — G301 Alzheimer's disease with late onset: Secondary | ICD-10-CM | POA: Diagnosis not present

## 2020-07-09 DIAGNOSIS — F0281 Dementia in other diseases classified elsewhere with behavioral disturbance: Secondary | ICD-10-CM | POA: Diagnosis not present

## 2020-07-09 DIAGNOSIS — I4891 Unspecified atrial fibrillation: Secondary | ICD-10-CM | POA: Diagnosis not present

## 2020-09-22 DIAGNOSIS — Z0000 Encounter for general adult medical examination without abnormal findings: Secondary | ICD-10-CM | POA: Diagnosis not present

## 2020-09-22 DIAGNOSIS — Z299 Encounter for prophylactic measures, unspecified: Secondary | ICD-10-CM | POA: Diagnosis not present

## 2020-09-22 DIAGNOSIS — Z6828 Body mass index (BMI) 28.0-28.9, adult: Secondary | ICD-10-CM | POA: Diagnosis not present

## 2020-09-22 DIAGNOSIS — E7800 Pure hypercholesterolemia, unspecified: Secondary | ICD-10-CM | POA: Diagnosis not present

## 2020-09-22 DIAGNOSIS — Z1339 Encounter for screening examination for other mental health and behavioral disorders: Secondary | ICD-10-CM | POA: Diagnosis not present

## 2020-09-22 DIAGNOSIS — Z87891 Personal history of nicotine dependence: Secondary | ICD-10-CM | POA: Diagnosis not present

## 2020-09-22 DIAGNOSIS — Z7189 Other specified counseling: Secondary | ICD-10-CM | POA: Diagnosis not present

## 2020-09-22 DIAGNOSIS — Z1331 Encounter for screening for depression: Secondary | ICD-10-CM | POA: Diagnosis not present

## 2020-09-22 DIAGNOSIS — R5383 Other fatigue: Secondary | ICD-10-CM | POA: Diagnosis not present

## 2020-09-22 DIAGNOSIS — E039 Hypothyroidism, unspecified: Secondary | ICD-10-CM | POA: Diagnosis not present

## 2021-01-11 DIAGNOSIS — G301 Alzheimer's disease with late onset: Secondary | ICD-10-CM | POA: Diagnosis not present

## 2021-01-11 DIAGNOSIS — N390 Urinary tract infection, site not specified: Secondary | ICD-10-CM | POA: Diagnosis not present

## 2021-01-11 DIAGNOSIS — Z299 Encounter for prophylactic measures, unspecified: Secondary | ICD-10-CM | POA: Diagnosis not present

## 2021-01-11 DIAGNOSIS — Z23 Encounter for immunization: Secondary | ICD-10-CM | POA: Diagnosis not present

## 2021-01-11 DIAGNOSIS — I10 Essential (primary) hypertension: Secondary | ICD-10-CM | POA: Diagnosis not present

## 2021-01-11 DIAGNOSIS — R350 Frequency of micturition: Secondary | ICD-10-CM | POA: Diagnosis not present

## 2021-03-10 DIAGNOSIS — I10 Essential (primary) hypertension: Secondary | ICD-10-CM | POA: Diagnosis not present

## 2021-03-10 DIAGNOSIS — Z299 Encounter for prophylactic measures, unspecified: Secondary | ICD-10-CM | POA: Diagnosis not present

## 2021-03-10 DIAGNOSIS — R350 Frequency of micturition: Secondary | ICD-10-CM | POA: Diagnosis not present

## 2021-03-10 DIAGNOSIS — N390 Urinary tract infection, site not specified: Secondary | ICD-10-CM | POA: Diagnosis not present

## 2021-03-10 DIAGNOSIS — Z6829 Body mass index (BMI) 29.0-29.9, adult: Secondary | ICD-10-CM | POA: Diagnosis not present

## 2021-05-10 DIAGNOSIS — R410 Disorientation, unspecified: Secondary | ICD-10-CM | POA: Diagnosis not present

## 2021-05-10 DIAGNOSIS — I10 Essential (primary) hypertension: Secondary | ICD-10-CM | POA: Diagnosis not present

## 2021-05-10 DIAGNOSIS — Z299 Encounter for prophylactic measures, unspecified: Secondary | ICD-10-CM | POA: Diagnosis not present

## 2021-05-10 DIAGNOSIS — E039 Hypothyroidism, unspecified: Secondary | ICD-10-CM | POA: Diagnosis not present

## 2021-05-10 DIAGNOSIS — Z789 Other specified health status: Secondary | ICD-10-CM | POA: Diagnosis not present

## 2021-05-10 DIAGNOSIS — I4891 Unspecified atrial fibrillation: Secondary | ICD-10-CM | POA: Diagnosis not present

## 2021-05-10 DIAGNOSIS — G301 Alzheimer's disease with late onset: Secondary | ICD-10-CM | POA: Diagnosis not present

## 2021-05-10 DIAGNOSIS — R5383 Other fatigue: Secondary | ICD-10-CM | POA: Diagnosis not present

## 2021-06-09 DIAGNOSIS — I10 Essential (primary) hypertension: Secondary | ICD-10-CM | POA: Diagnosis not present

## 2021-06-09 DIAGNOSIS — G4700 Insomnia, unspecified: Secondary | ICD-10-CM | POA: Diagnosis not present

## 2021-06-09 DIAGNOSIS — Z299 Encounter for prophylactic measures, unspecified: Secondary | ICD-10-CM | POA: Diagnosis not present

## 2021-06-09 DIAGNOSIS — N390 Urinary tract infection, site not specified: Secondary | ICD-10-CM | POA: Diagnosis not present

## 2021-06-09 DIAGNOSIS — I4891 Unspecified atrial fibrillation: Secondary | ICD-10-CM | POA: Diagnosis not present

## 2021-06-09 DIAGNOSIS — Z6828 Body mass index (BMI) 28.0-28.9, adult: Secondary | ICD-10-CM | POA: Diagnosis not present

## 2021-06-16 DIAGNOSIS — E039 Hypothyroidism, unspecified: Secondary | ICD-10-CM | POA: Diagnosis not present

## 2021-06-16 DIAGNOSIS — Z299 Encounter for prophylactic measures, unspecified: Secondary | ICD-10-CM | POA: Diagnosis not present

## 2021-06-16 DIAGNOSIS — I10 Essential (primary) hypertension: Secondary | ICD-10-CM | POA: Diagnosis not present

## 2021-06-16 DIAGNOSIS — G4700 Insomnia, unspecified: Secondary | ICD-10-CM | POA: Diagnosis not present

## 2021-06-16 DIAGNOSIS — I4891 Unspecified atrial fibrillation: Secondary | ICD-10-CM | POA: Diagnosis not present

## 2021-10-21 DIAGNOSIS — Z1331 Encounter for screening for depression: Secondary | ICD-10-CM | POA: Diagnosis not present

## 2021-10-21 DIAGNOSIS — Z6826 Body mass index (BMI) 26.0-26.9, adult: Secondary | ICD-10-CM | POA: Diagnosis not present

## 2021-10-21 DIAGNOSIS — Z0000 Encounter for general adult medical examination without abnormal findings: Secondary | ICD-10-CM | POA: Diagnosis not present

## 2021-10-21 DIAGNOSIS — I10 Essential (primary) hypertension: Secondary | ICD-10-CM | POA: Diagnosis not present

## 2021-10-21 DIAGNOSIS — R5383 Other fatigue: Secondary | ICD-10-CM | POA: Diagnosis not present

## 2021-10-21 DIAGNOSIS — Z1339 Encounter for screening examination for other mental health and behavioral disorders: Secondary | ICD-10-CM | POA: Diagnosis not present

## 2021-10-21 DIAGNOSIS — E039 Hypothyroidism, unspecified: Secondary | ICD-10-CM | POA: Diagnosis not present

## 2021-10-21 DIAGNOSIS — E7800 Pure hypercholesterolemia, unspecified: Secondary | ICD-10-CM | POA: Diagnosis not present

## 2021-10-21 DIAGNOSIS — Z299 Encounter for prophylactic measures, unspecified: Secondary | ICD-10-CM | POA: Diagnosis not present

## 2021-10-21 DIAGNOSIS — Z7189 Other specified counseling: Secondary | ICD-10-CM | POA: Diagnosis not present

## 2021-10-27 DIAGNOSIS — E039 Hypothyroidism, unspecified: Secondary | ICD-10-CM | POA: Diagnosis not present

## 2021-10-27 DIAGNOSIS — R5383 Other fatigue: Secondary | ICD-10-CM | POA: Diagnosis not present

## 2021-10-27 DIAGNOSIS — E7800 Pure hypercholesterolemia, unspecified: Secondary | ICD-10-CM | POA: Diagnosis not present

## 2021-10-27 DIAGNOSIS — Z79899 Other long term (current) drug therapy: Secondary | ICD-10-CM | POA: Diagnosis not present

## 2022-03-18 DIAGNOSIS — Z299 Encounter for prophylactic measures, unspecified: Secondary | ICD-10-CM | POA: Diagnosis not present

## 2022-03-18 DIAGNOSIS — I10 Essential (primary) hypertension: Secondary | ICD-10-CM | POA: Diagnosis not present

## 2022-03-18 DIAGNOSIS — R319 Hematuria, unspecified: Secondary | ICD-10-CM | POA: Diagnosis not present

## 2022-03-18 DIAGNOSIS — R350 Frequency of micturition: Secondary | ICD-10-CM | POA: Diagnosis not present

## 2022-03-18 DIAGNOSIS — K649 Unspecified hemorrhoids: Secondary | ICD-10-CM | POA: Diagnosis not present

## 2022-04-22 DIAGNOSIS — I4891 Unspecified atrial fibrillation: Secondary | ICD-10-CM | POA: Diagnosis not present

## 2022-04-22 DIAGNOSIS — N390 Urinary tract infection, site not specified: Secondary | ICD-10-CM | POA: Diagnosis not present

## 2022-04-22 DIAGNOSIS — D6869 Other thrombophilia: Secondary | ICD-10-CM | POA: Diagnosis not present

## 2022-04-22 DIAGNOSIS — G301 Alzheimer's disease with late onset: Secondary | ICD-10-CM | POA: Diagnosis not present

## 2022-04-25 DIAGNOSIS — E039 Hypothyroidism, unspecified: Secondary | ICD-10-CM | POA: Diagnosis not present

## 2022-04-25 DIAGNOSIS — Z1339 Encounter for screening examination for other mental health and behavioral disorders: Secondary | ICD-10-CM | POA: Diagnosis not present

## 2022-04-25 DIAGNOSIS — R5383 Other fatigue: Secondary | ICD-10-CM | POA: Diagnosis not present

## 2022-04-25 DIAGNOSIS — E7800 Pure hypercholesterolemia, unspecified: Secondary | ICD-10-CM | POA: Diagnosis not present

## 2022-04-25 DIAGNOSIS — Z299 Encounter for prophylactic measures, unspecified: Secondary | ICD-10-CM | POA: Diagnosis not present

## 2022-04-25 DIAGNOSIS — R350 Frequency of micturition: Secondary | ICD-10-CM | POA: Diagnosis not present

## 2022-04-25 DIAGNOSIS — Z1331 Encounter for screening for depression: Secondary | ICD-10-CM | POA: Diagnosis not present

## 2022-04-25 DIAGNOSIS — I10 Essential (primary) hypertension: Secondary | ICD-10-CM | POA: Diagnosis not present

## 2022-04-25 DIAGNOSIS — Z0000 Encounter for general adult medical examination without abnormal findings: Secondary | ICD-10-CM | POA: Diagnosis not present

## 2022-04-25 DIAGNOSIS — Z7189 Other specified counseling: Secondary | ICD-10-CM | POA: Diagnosis not present

## 2022-07-14 DIAGNOSIS — Z299 Encounter for prophylactic measures, unspecified: Secondary | ICD-10-CM | POA: Diagnosis not present

## 2022-07-14 DIAGNOSIS — R4182 Altered mental status, unspecified: Secondary | ICD-10-CM | POA: Diagnosis not present

## 2022-07-14 DIAGNOSIS — I10 Essential (primary) hypertension: Secondary | ICD-10-CM | POA: Diagnosis not present

## 2022-07-14 DIAGNOSIS — N390 Urinary tract infection, site not specified: Secondary | ICD-10-CM | POA: Diagnosis not present

## 2022-08-19 DIAGNOSIS — I10 Essential (primary) hypertension: Secondary | ICD-10-CM | POA: Diagnosis not present

## 2022-08-19 DIAGNOSIS — R269 Unspecified abnormalities of gait and mobility: Secondary | ICD-10-CM | POA: Diagnosis not present

## 2022-08-19 DIAGNOSIS — R2681 Unsteadiness on feet: Secondary | ICD-10-CM | POA: Diagnosis not present

## 2022-09-05 DIAGNOSIS — G4700 Insomnia, unspecified: Secondary | ICD-10-CM | POA: Diagnosis not present

## 2022-09-05 DIAGNOSIS — J45909 Unspecified asthma, uncomplicated: Secondary | ICD-10-CM | POA: Diagnosis not present

## 2022-09-05 DIAGNOSIS — R062 Wheezing: Secondary | ICD-10-CM | POA: Diagnosis not present

## 2022-09-09 DIAGNOSIS — Z7901 Long term (current) use of anticoagulants: Secondary | ICD-10-CM | POA: Diagnosis not present

## 2022-09-09 DIAGNOSIS — R0902 Hypoxemia: Secondary | ICD-10-CM | POA: Diagnosis not present

## 2022-09-09 DIAGNOSIS — I517 Cardiomegaly: Secondary | ICD-10-CM | POA: Diagnosis not present

## 2022-09-09 DIAGNOSIS — R0602 Shortness of breath: Secondary | ICD-10-CM | POA: Diagnosis not present

## 2022-09-09 DIAGNOSIS — N390 Urinary tract infection, site not specified: Secondary | ICD-10-CM | POA: Diagnosis not present

## 2022-09-09 DIAGNOSIS — E039 Hypothyroidism, unspecified: Secondary | ICD-10-CM | POA: Diagnosis not present

## 2022-09-09 DIAGNOSIS — J9601 Acute respiratory failure with hypoxia: Secondary | ICD-10-CM | POA: Diagnosis not present

## 2022-09-09 DIAGNOSIS — I509 Heart failure, unspecified: Secondary | ICD-10-CM | POA: Diagnosis not present

## 2022-09-09 DIAGNOSIS — Z792 Long term (current) use of antibiotics: Secondary | ICD-10-CM | POA: Diagnosis not present

## 2022-09-09 DIAGNOSIS — N3000 Acute cystitis without hematuria: Secondary | ICD-10-CM | POA: Diagnosis not present

## 2022-09-09 DIAGNOSIS — R069 Unspecified abnormalities of breathing: Secondary | ICD-10-CM | POA: Diagnosis not present

## 2022-09-09 DIAGNOSIS — R059 Cough, unspecified: Secondary | ICD-10-CM | POA: Diagnosis not present

## 2022-09-09 DIAGNOSIS — J90 Pleural effusion, not elsewhere classified: Secondary | ICD-10-CM | POA: Diagnosis not present

## 2022-09-09 DIAGNOSIS — I4891 Unspecified atrial fibrillation: Secondary | ICD-10-CM | POA: Diagnosis not present

## 2022-09-09 DIAGNOSIS — Z20822 Contact with and (suspected) exposure to covid-19: Secondary | ICD-10-CM | POA: Diagnosis not present

## 2022-09-09 DIAGNOSIS — I959 Hypotension, unspecified: Secondary | ICD-10-CM | POA: Diagnosis not present

## 2022-09-09 DIAGNOSIS — J45909 Unspecified asthma, uncomplicated: Secondary | ICD-10-CM | POA: Diagnosis not present

## 2022-09-09 DIAGNOSIS — Z9981 Dependence on supplemental oxygen: Secondary | ICD-10-CM | POA: Diagnosis not present

## 2022-09-09 DIAGNOSIS — I2510 Atherosclerotic heart disease of native coronary artery without angina pectoris: Secondary | ICD-10-CM | POA: Diagnosis not present

## 2022-09-09 DIAGNOSIS — R54 Age-related physical debility: Secondary | ICD-10-CM | POA: Diagnosis not present

## 2022-09-09 DIAGNOSIS — I482 Chronic atrial fibrillation, unspecified: Secondary | ICD-10-CM | POA: Diagnosis not present

## 2022-09-09 DIAGNOSIS — J189 Pneumonia, unspecified organism: Secondary | ICD-10-CM | POA: Diagnosis not present

## 2022-09-09 DIAGNOSIS — E785 Hyperlipidemia, unspecified: Secondary | ICD-10-CM | POA: Diagnosis not present

## 2022-09-09 DIAGNOSIS — I10 Essential (primary) hypertension: Secondary | ICD-10-CM | POA: Diagnosis not present

## 2022-09-09 DIAGNOSIS — I5023 Acute on chronic systolic (congestive) heart failure: Secondary | ICD-10-CM | POA: Diagnosis not present

## 2022-09-09 DIAGNOSIS — Z7951 Long term (current) use of inhaled steroids: Secondary | ICD-10-CM | POA: Diagnosis not present

## 2022-09-09 DIAGNOSIS — I110 Hypertensive heart disease with heart failure: Secondary | ICD-10-CM | POA: Diagnosis not present

## 2022-09-09 DIAGNOSIS — Z1152 Encounter for screening for COVID-19: Secondary | ICD-10-CM | POA: Diagnosis not present

## 2022-10-24 DIAGNOSIS — I10 Essential (primary) hypertension: Secondary | ICD-10-CM | POA: Diagnosis not present

## 2022-10-24 DIAGNOSIS — E039 Hypothyroidism, unspecified: Secondary | ICD-10-CM | POA: Diagnosis not present

## 2022-10-24 DIAGNOSIS — Z299 Encounter for prophylactic measures, unspecified: Secondary | ICD-10-CM | POA: Diagnosis not present

## 2022-10-24 DIAGNOSIS — R5383 Other fatigue: Secondary | ICD-10-CM | POA: Diagnosis not present

## 2022-10-24 DIAGNOSIS — Z0000 Encounter for general adult medical examination without abnormal findings: Secondary | ICD-10-CM | POA: Diagnosis not present

## 2022-10-24 DIAGNOSIS — E7800 Pure hypercholesterolemia, unspecified: Secondary | ICD-10-CM | POA: Diagnosis not present

## 2023-03-27 DIAGNOSIS — R2681 Unsteadiness on feet: Secondary | ICD-10-CM | POA: Diagnosis not present

## 2023-03-27 DIAGNOSIS — I4891 Unspecified atrial fibrillation: Secondary | ICD-10-CM | POA: Diagnosis not present

## 2023-03-27 DIAGNOSIS — G301 Alzheimer's disease with late onset: Secondary | ICD-10-CM | POA: Diagnosis not present

## 2023-03-27 DIAGNOSIS — N390 Urinary tract infection, site not specified: Secondary | ICD-10-CM | POA: Diagnosis not present

## 2023-04-18 DIAGNOSIS — Z299 Encounter for prophylactic measures, unspecified: Secondary | ICD-10-CM | POA: Diagnosis not present

## 2023-04-18 DIAGNOSIS — R2681 Unsteadiness on feet: Secondary | ICD-10-CM | POA: Diagnosis not present

## 2023-04-18 DIAGNOSIS — I4891 Unspecified atrial fibrillation: Secondary | ICD-10-CM | POA: Diagnosis not present

## 2023-04-18 DIAGNOSIS — I10 Essential (primary) hypertension: Secondary | ICD-10-CM | POA: Diagnosis not present

## 2023-04-18 DIAGNOSIS — G301 Alzheimer's disease with late onset: Secondary | ICD-10-CM | POA: Diagnosis not present

## 2023-04-21 ENCOUNTER — Emergency Department (HOSPITAL_COMMUNITY): Payer: Medicare Other

## 2023-04-21 ENCOUNTER — Observation Stay (HOSPITAL_COMMUNITY)
Admission: EM | Admit: 2023-04-21 | Discharge: 2023-04-23 | Disposition: A | Discharge status: Home Health | Payer: Medicare Other | Attending: Internal Medicine | Admitting: Internal Medicine

## 2023-04-21 ENCOUNTER — Encounter (HOSPITAL_COMMUNITY): Payer: Self-pay | Admitting: Emergency Medicine

## 2023-04-21 ENCOUNTER — Other Ambulatory Visit: Payer: Self-pay

## 2023-04-21 DIAGNOSIS — Z471 Aftercare following joint replacement surgery: Secondary | ICD-10-CM | POA: Diagnosis not present

## 2023-04-21 DIAGNOSIS — I4891 Unspecified atrial fibrillation: Secondary | ICD-10-CM | POA: Diagnosis not present

## 2023-04-21 DIAGNOSIS — R918 Other nonspecific abnormal finding of lung field: Secondary | ICD-10-CM | POA: Diagnosis not present

## 2023-04-21 DIAGNOSIS — R197 Diarrhea, unspecified: Secondary | ICD-10-CM | POA: Diagnosis not present

## 2023-04-21 DIAGNOSIS — J4489 Other specified chronic obstructive pulmonary disease: Secondary | ICD-10-CM | POA: Diagnosis present

## 2023-04-21 DIAGNOSIS — R2689 Other abnormalities of gait and mobility: Secondary | ICD-10-CM | POA: Diagnosis not present

## 2023-04-21 DIAGNOSIS — R0902 Hypoxemia: Secondary | ICD-10-CM | POA: Diagnosis not present

## 2023-04-21 DIAGNOSIS — Z79899 Other long term (current) drug therapy: Secondary | ICD-10-CM | POA: Diagnosis not present

## 2023-04-21 DIAGNOSIS — I2510 Atherosclerotic heart disease of native coronary artery without angina pectoris: Secondary | ICD-10-CM | POA: Diagnosis not present

## 2023-04-21 DIAGNOSIS — M6281 Muscle weakness (generalized): Secondary | ICD-10-CM | POA: Diagnosis not present

## 2023-04-21 DIAGNOSIS — R55 Syncope and collapse: Secondary | ICD-10-CM | POA: Diagnosis not present

## 2023-04-21 DIAGNOSIS — R000 Tachycardia, unspecified: Secondary | ICD-10-CM | POA: Diagnosis not present

## 2023-04-21 DIAGNOSIS — I6529 Occlusion and stenosis of unspecified carotid artery: Secondary | ICD-10-CM | POA: Diagnosis not present

## 2023-04-21 DIAGNOSIS — E782 Mixed hyperlipidemia: Secondary | ICD-10-CM | POA: Diagnosis present

## 2023-04-21 DIAGNOSIS — Z8673 Personal history of transient ischemic attack (TIA), and cerebral infarction without residual deficits: Secondary | ICD-10-CM | POA: Diagnosis not present

## 2023-04-21 DIAGNOSIS — I517 Cardiomegaly: Secondary | ICD-10-CM | POA: Diagnosis not present

## 2023-04-21 DIAGNOSIS — Z87891 Personal history of nicotine dependence: Secondary | ICD-10-CM | POA: Insufficient documentation

## 2023-04-21 DIAGNOSIS — Z7901 Long term (current) use of anticoagulants: Secondary | ICD-10-CM | POA: Diagnosis not present

## 2023-04-21 DIAGNOSIS — R2681 Unsteadiness on feet: Secondary | ICD-10-CM | POA: Diagnosis not present

## 2023-04-21 DIAGNOSIS — R4182 Altered mental status, unspecified: Secondary | ICD-10-CM | POA: Diagnosis not present

## 2023-04-21 LAB — CBC
HCT: 40.6 % (ref 36.0–46.0)
Hemoglobin: 13.1 g/dL (ref 12.0–15.0)
MCH: 30.3 pg (ref 26.0–34.0)
MCHC: 32.3 g/dL (ref 30.0–36.0)
MCV: 93.8 fL (ref 80.0–100.0)
Platelets: 210 10*3/uL (ref 150–400)
RBC: 4.33 MIL/uL (ref 3.87–5.11)
RDW: 16.8 % — ABNORMAL HIGH (ref 11.5–15.5)
WBC: 15.4 10*3/uL — ABNORMAL HIGH (ref 4.0–10.5)
nRBC: 0 % (ref 0.0–0.2)

## 2023-04-21 LAB — BASIC METABOLIC PANEL
Anion gap: 11 (ref 5–15)
BUN: 19 mg/dL (ref 8–23)
CO2: 24 mmol/L (ref 22–32)
Calcium: 8.4 mg/dL — ABNORMAL LOW (ref 8.9–10.3)
Chloride: 107 mmol/L (ref 98–111)
Creatinine, Ser: 0.73 mg/dL (ref 0.44–1.00)
GFR, Estimated: >60 mL/min (ref >60)
Glucose, Bld: 138 mg/dL — ABNORMAL HIGH (ref 70–99)
Potassium: 4.3 mmol/L (ref 3.5–5.1)
Sodium: 142 mmol/L (ref 135–145)

## 2023-04-21 LAB — CBG MONITORING, ED: Glucose-Capillary: 136 mg/dL — ABNORMAL HIGH (ref 70–99)

## 2023-04-21 LAB — URINALYSIS, ROUTINE W REFLEX MICROSCOPIC
Bilirubin Urine: NEGATIVE
Glucose, UA: NEGATIVE mg/dL
Hgb urine dipstick: NEGATIVE
Ketones, ur: NEGATIVE mg/dL
Leukocytes,Ua: NEGATIVE
Nitrite: NEGATIVE
Protein, ur: NEGATIVE mg/dL
Specific Gravity, Urine: 1.018 (ref 1.005–1.030)
pH: 6 (ref 5.0–8.0)

## 2023-04-21 LAB — TROPONIN I (HIGH SENSITIVITY)
Troponin I (High Sensitivity): 7 ng/L (ref <18)
Troponin I (High Sensitivity): 7 ng/L (ref <18)

## 2023-04-21 MED ORDER — DILTIAZEM HCL-DEXTROSE 125-5 MG/125ML-% IV SOLN (PREMIX)
5.0000 mg/h | INTRAVENOUS | Status: DC
  Administered 2023-04-21: 5 mg/h via INTRAVENOUS

## 2023-04-21 MED ORDER — ACETAMINOPHEN 325 MG PO TABS: 650.0000 mg | ORAL_TABLET | ORAL | Status: DC | PRN

## 2023-04-21 MED ORDER — SODIUM CHLORIDE 0.9 % IV BOLUS
500.0000 mL | Freq: Once | INTRAVENOUS | Status: AC
  Administered 2023-04-21: 500 mL via INTRAVENOUS

## 2023-04-21 MED ORDER — CHLORHEXIDINE GLUCONATE CLOTH 2 % EX PADS
6.0000 | MEDICATED_PAD | Freq: Every day | CUTANEOUS | Status: DC
  Administered 2023-04-21 – 2023-04-23 (×3): 6 via TOPICAL

## 2023-04-21 MED ORDER — DILTIAZEM HCL-DEXTROSE 125-5 MG/125ML-% IV SOLN (PREMIX)
5.0000 mg/h | INTRAVENOUS | Status: DC
  Administered 2023-04-21: 5 mg/h via INTRAVENOUS
  Filled 2023-04-21: qty 125

## 2023-04-21 MED ORDER — METOPROLOL TARTRATE 25 MG PO TABS
12.5000 mg | ORAL_TABLET | Freq: Two times a day (BID) | ORAL | Status: DC
  Administered 2023-04-21 – 2023-04-23 (×4): 12.5 mg via ORAL
  Filled 2023-04-21 (×4): qty 1

## 2023-04-21 MED ORDER — LISINOPRIL 10 MG PO TABS
10.0000 mg | ORAL_TABLET | Freq: Every day | ORAL | Status: DC
  Administered 2023-04-22 – 2023-04-23 (×2): 10 mg via ORAL
  Filled 2023-04-21 (×2): qty 1

## 2023-04-21 MED ORDER — ONDANSETRON HCL 4 MG/2ML IJ SOLN: 4.0000 mg | Freq: Four times a day (QID) | INTRAMUSCULAR | Status: DC | PRN

## 2023-04-21 MED ORDER — ORAL CARE MOUTH RINSE: 15.0000 mL | OROMUCOSAL | Status: DC | PRN

## 2023-04-21 MED ORDER — APIXABAN 5 MG PO TABS
5.0000 mg | ORAL_TABLET | Freq: Two times a day (BID) | ORAL | Status: DC
  Administered 2023-04-21 – 2023-04-23 (×4): 5 mg via ORAL
  Filled 2023-04-21 (×4): qty 1

## 2023-04-21 NOTE — ED Notes (Signed)
Patient transported to MRI

## 2023-04-21 NOTE — ED Notes (Signed)
ED TO INPATIENT HANDOFF REPORT  ED Nurse Name and Phone #: Lynett Fish RN [phone]  S Name/Age/Gender Hannah Estes 89 y.o. female Room/Bed: APA14/APA14  Code Status   Code Status: Not on file  Home/SNF/Other Home Patient oriented to: self Is this baseline? Yes   Triage Complete: Triage complete  Chief Complaint Atrial fibrillation with RVR [facility]) [I48.91]  Triage Note Patient arrives to triage via EMS. Patient had a syncopal episode after getting out of shower.  Family describes encounter as patient slumping over and started to drool, and lip turned blue.  Respirations even and unlabored during triage.  Patient oriented to baseline during triage.    Hx of afib and dementia.     Allergies Allergies  Allergen Reactions   Levaquin [Levofloxacin In D5w] Hives   Penicillins Hives    Level of Care/Admitting Diagnosis ED Disposition     ED Disposition  Admit   Condition  --   Comment  Hospital Area: [facility] [[id number]]  Level of Care: Stepdown [14]  Covid Evaluation: Asymptomatic - no recent exposure (last 10 days) testing not required  Diagnosis: Atrial fibrillation with RVR [facility]) [[id number]]  Admitting Physician: Levie Heritage [[id number]]  Attending Physician: Levie Heritage [[id number]]          B Medical/Surgery History Past Medical History:  Diagnosis Date   Carotid artery disease (HCC)    COPD (chronic obstructive pulmonary disease) (HCC)    Coronary artery disease    Multivessel, LVEF 65%   GERD (gastroesophageal reflux disease)    Hyperlipidemia    Myocardial infarction (HCC)    NSTEMI 6/09   Past Surgical History:  Procedure Laterality Date   CATARACT EXTRACTION W/PHACO  03/05/2012   Procedure: CATARACT EXTRACTION PHACO AND INTRAOCULAR LENS PLACEMENT (IOC);  Surgeon: Gemma Payor, MD;  Location: [facility];  Service: Ophthalmology;  Laterality: Left;  CDE 25.71   CATARACT EXTRACTION W/PHACO  03/19/2012   Procedure: CATARACT EXTRACTION  PHACO AND INTRAOCULAR LENS PLACEMENT (IOC);  Surgeon: Gemma Payor, MD;  Location: [facility];  Service: Ophthalmology;  Laterality: Right;  CDE:24.94   CORONARY ARTERY BYPASS GRAFT  03/29/2007   Off pump LIMA to LAD, SVG to first obtuse marginal , SVG to PDA       A IV Location/Drains/Wounds Patient Lines/Drains/Airways Status     Active Line/Drains/Airways     Name Placement date Placement time Site Days   Peripheral IV 04/21/23 20 G Right;Lateral Forearm 04/21/23  1509  Forearm  less than 1   Peripheral IV 04/21/23 22 G Anterior;Left Forearm 04/21/23  1742  Forearm  less than 1   Incision 03/05/12 Eye Left 03/05/12  0739  -- 4064   Incision 03/19/12 Eye Right 03/19/12  1259  -- 4050            Intake/Output Last 24 hours No intake or output data in the 24 hours ending 04/21/23 1941  Labs/Imaging Results for orders placed or performed during the hospital encounter of 04/21/23 (from the past 48 hours)  Urinalysis, Routine w reflex microscopic -Urine, Clean Catch     Status: Abnormal   Collection Time: 04/21/23  3:11 PM  Result Value Ref Range   Color, Urine AMBER (A) YELLOW    Comment: BIOCHEMICALS MAY BE AFFECTED BY COLOR   APPearance HAZY (A) CLEAR   Specific Gravity, Urine 1.018 1.005 - 1.030   pH 6.0 5.0 - 8.0   Glucose, UA NEGATIVE NEGATIVE mg/dL   Hgb urine dipstick NEGATIVE  NEGATIVE   Bilirubin Urine NEGATIVE NEGATIVE   Ketones, ur NEGATIVE NEGATIVE mg/dL   Protein, ur NEGATIVE NEGATIVE mg/dL   Nitrite NEGATIVE NEGATIVE   Leukocytes,Ua NEGATIVE NEGATIVE    Comment: Performed at [facility], [address] [place], [place] [address]  Basic metabolic panel     Status: Abnormal   Collection Time: 04/21/23  3:40 PM  Result Value Ref Range   Sodium 142 135 - 145 mmol/L   Potassium 4.3 3.5 - 5.1 mmol/L   Chloride 107 98 - 111 mmol/L   CO2 24 22 - 32 mmol/L   Glucose, Bld 138 (H) 70 - 99 mg/dL    Comment: Glucose reference range applies only to samples taken after  fasting for at least 8 hours.   BUN 19 8 - 23 mg/dL   Creatinine, Ser 4.74 0.44 - 1.00 mg/dL   Calcium 8.4 (L) 8.9 - 10.3 mg/dL   GFR, Estimated >25 >95 mL/min    Comment: (NOTE) Calculated using the CKD-EPI Creatinine Equation (2021)    Anion gap 11 5 - 15    Comment: Performed at [facility], [address] [place], [place] [address]  CBC     Status: Abnormal   Collection Time: 04/21/23  3:40 PM  Result Value Ref Range   WBC 15.4 (H) 4.0 - 10.5 K/uL   RBC 4.33 3.87 - 5.11 MIL/uL   Hemoglobin 13.1 12.0 - 15.0 g/dL   HCT 64.3 32.9 - 51.8 %   MCV 93.8 80.0 - 100.0 fL   MCH 30.3 26.0 - 34.0 pg   MCHC 32.3 30.0 - 36.0 g/dL   RDW 84.1 (H) 66.0 - 63.0 %   Platelets 210 150 - 400 K/uL   nRBC 0.0 0.0 - 0.2 %    Comment: Performed at [facility], [address] [place], [place] [address]  Troponin I (High Sensitivity)     Status: None   Collection Time: 04/21/23  3:40 PM  Result Value Ref Range   Troponin I (High Sensitivity) 7 <18 ng/L    Comment: (NOTE) Elevated high sensitivity troponin I (hsTnI) values and significant  changes across serial measurements may suggest ACS but many other  chronic and acute conditions are known to elevate hsTnI results.  Refer to the "Links" section for chest pain algorithms and additional  guidance. Performed at [facility], [address], [place], [place] [address]   CBG monitoring, ED     Status: Abnormal   Collection Time: 04/21/23  3:47 PM  Result Value Ref Range   Glucose-Capillary 136 (H) 70 - 99 mg/dL    Comment: Glucose reference range applies only to samples taken after fasting for at least 8 hours.  Troponin I (High Sensitivity)     Status: None   Collection Time: 04/21/23  6:33 PM  Result Value Ref Range   Troponin I (High Sensitivity) 7 <18 ng/L    Comment: (NOTE) Elevated high sensitivity troponin I (hsTnI) values and significant  changes across serial measurements may suggest ACS but many other  chronic and acute conditions  are known to elevate hsTnI results.  Refer to the "Links" section for chest pain algorithms and additional  guidance. Performed at [facility], [address] [place], [place] [address]    MR BRAIN WO CONTRAST Result Date: 04/21/2023 CLINICAL DATA:  Mental status change, unknown cause EXAM: MRI HEAD WITHOUT CONTRAST TECHNIQUE: Multiplanar, multiecho pulse sequences of the brain and surrounding structures were obtained without intravenous contrast. COMPARISON:  No prior MRI available, correlation is made with 01/15/2004 CT head FINDINGS: Brain: No restricted diffusion to suggest acute or subacute infarct. No acute hemorrhage, mass, mass effect, or midline shift. No hydrocephalus or extra-axial collection. Pituitary and craniocervical junction within normal limits. Punctate foci of hemosiderin deposition in the pons and left corona radiata, likely sequela of prior hypertensive microhemorrhages. Confluent T2 hyperintense signal in the periventricular white matter, likely the sequela of severe chronic small vessel ischemic disease. 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 Vascular: Loss of the left vertebral artery flow void. Otherwise normal arterial flow voids. Skull and upper cervical spine: Normal marrow signal. Sinuses/Orbits: Mucosal thickening right frontal sinus. Status post bilateral lens replacements. Other: The mastoid air cells are well aerated. IMPRESSION: 1. No acute intracranial process. No evidence of acute or subacute infarct. 2. Loss of the left vertebral artery flow void, which is nonspecific but can be seen in the setting of slow flow or occlusion. If there is clinical concern for vertebral artery pathology, further evaluation with CTA head and neck is recommended. Electronically Signed   By: Wiliam Ke M.D.   On: 04/21/2023 18:54   DG Chest Portable 1 View Result Date: 04/21/2023 CLINICAL DATA:  Syncope, low oxygen saturation. EXAM: PORTABLE CHEST 1 VIEW COMPARISON:   09/08/2012 FINDINGS: Prior median sternotomy. Chronic cardiomegaly post CABG. Aortic atherosclerosis. Chronic interstitial coarsening. Chronic elevation of right hemidiaphragm. Left lung base opacity may represent pleural effusion and airspace disease/atelectasis. No pneumothorax. IMPRESSION: 1. Left lung base opacity may represent pleural effusion and airspace disease/atelectasis. 2. Chronic cardiomegaly post CABG. Electronically Signed   By: Narda Rutherford M.D.   On: 04/21/2023 17:20    Pending Labs Unresulted Labs (From admission, onward)    None       Vitals/Pain Today's Vitals   04/21/23 1915 04/21/23 1920 04/21/23 1925 04/21/23 1938  BP: 121/73 121/67 112/68   Pulse: 82 95 94   Resp: 18 17 16    Temp:    (!) 97.5 F (36.4 C)  TempSrc:    Axillary  SpO2: 95% 90% 95%     Isolation Precautions No active isolations  Medications Medications  diltiazem (CARDIZEM) 125 mg in dextrose 5% 125 mL (1 mg/mL) infusion (5 mg/hr Intravenous Rate/Dose Verify 04/21/23 1825)  sodium chloride 0.9 % bolus 500 mL (0 mLs Intravenous Stopped 04/21/23 1733)    Mobility walks with device     Focused Assessments Neuro Assessment Handoff:  Swallow screen pass?  Not ordered at this time Cardiac Rhythm: Atrial fibrillation NIH Stroke Scale  Dizziness Present: No Headache Present: No Interval: Initial Level of Consciousness (1a.)   : Alert, keenly responsive LOC Questions (1b. )   : Answers neither question correctly LOC Commands (1c. )   : Performs neither task correctly Best Gaze (2. )  : Normal Visual (3. )  : No visual loss Facial Palsy (4. )    : Normal symmetrical movements Motor Arm, Left (5a. )   : No drift Motor Arm, Right (5b. ) : No drift Motor Leg, Left (6a. )  : No drift Motor Leg, Right (6b. ) : No drift Limb Ataxia (7. ): Absent Sensory (8. )  : Normal, no sensory loss Best Language (9. )  : No aphasia Dysarthria (10. ): Normal Extinction/Inattention (11.)   : No  Abnormality Complete NIHSS TOTAL: 4 Last date known well: 04/21/23   Neuro Assessment:   Neuro Checks:   Initial (04/21/23 1521)  Has TPA been  given? No If patient is a Neuro Trauma and patient is going to OR before floor call report to 4N Charge nurse: [phone] or [phone]   R Recommendations: See Admitting Provider Note  Report given to:   Additional Notes: .

## 2023-04-21 NOTE — ED Notes (Signed)
Hospitalist at bedside

## 2023-04-21 NOTE — Plan of Care (Signed)

## 2023-04-21 NOTE — ED Notes (Signed)
Patient transported to floor with monitor by RN.  All personal belongings transported with patient

## 2023-04-21 NOTE — ED Notes (Signed)
Patient transported back from MRI.

## 2023-04-21 NOTE — ED Provider Notes (Signed)
[place] EMERGENCY DEPARTMENT AT [facility] Provider Note   CSN: [id number] Arrival date & time: 04/21/23  1412     History {Add pertinent medical, surgical, social history, OB history to HPI:1} Chief Complaint  Patient presents with   Loss of Consciousness    Hannah Estes is a 89 y.o. female.  Patient has a history of atrial fibrillation and coronary artery disease.  She went to the restroom and had a stool when she got 30 with the stool.  Her caregiver gave her a shower and when she got out of the shower she had a syncopal episode.   Loss of Consciousness      Home Medications Prior to Admission medications   Medication Sig Start Date End Date Taking? Authorizing Provider  diltiazem (CARDIZEM CD) 180 MG 24 hr capsule TAKE 1 CAPSULE BY MOUTH ONCE DAILY. 10/27/17   Laqueta Linden, MD  ELIQUIS 5 MG TABS tablet TAKE 1 TABLET BY MOUTH TWICE DAILY. 01/31/18   Jonelle Sidle, MD  lisinopril (PRINIVIL,ZESTRIL) 10 MG tablet Take 10 mg by mouth daily.    [provider]  metoprolol tartrate (LOPRESSOR) 25 MG tablet Take 0.5 tablets (12.5 mg total) by mouth 2 (two) times daily. 04/26/19   Antoine Poche, MD  Multiple Vitamins-Minerals (MULTIVITAMIN WITH MINERALS) tablet Take 1 tablet by mouth every other day.     [provider]  nitroGLYCERIN (NITROSTAT) 0.4 MG SL tablet Place 0.4 mg under the tongue every 5 (five) minutes as needed. Chest pain. 11/25/10   Jonelle Sidle, MD  Omega-3 Fatty Acids (FISH OIL) 1200 MG CAPS Take 2 capsules by mouth 2 (two) times daily. OTC medication, 1200 mg capsule, only delivers 900 mg of fish oil, called a mini capsule    [provider]      Allergies    Levaquin [levofloxacin in d5w] and Penicillins    Review of Systems   Review of Systems  Cardiovascular:  Positive for syncope.    Physical Exam Updated Vital Signs BP 121/73   Pulse 82   Temp (!) 97.5 F (36.4 C) (Axillary)   Resp 18    SpO2 95%  Physical Exam  ED Results / Procedures / Treatments   Labs (all labs ordered are listed, but only abnormal results are displayed) Labs Reviewed  BASIC METABOLIC PANEL - Abnormal; Notable for the following components:      Result Value   Glucose, Bld 138 (*)    Calcium 8.4 (*)    All other components within normal limits  CBC - Abnormal; Notable for the following components:   WBC 15.4 (*)    RDW 16.8 (*)    All other components within normal limits  URINALYSIS, ROUTINE W REFLEX MICROSCOPIC - Abnormal; Notable for the following components:   Color, Urine AMBER (*)    APPearance HAZY (*)    All other components within normal limits  CBG MONITORING, ED - Abnormal; Notable for the following components:   Glucose-Capillary 136 (*)    All other components within normal limits  TROPONIN I (HIGH SENSITIVITY)  TROPONIN I (HIGH SENSITIVITY)    EKG None  Radiology MR BRAIN WO CONTRAST Result Date: 04/21/2023 CLINICAL DATA:  Mental status change, unknown cause EXAM: MRI HEAD WITHOUT CONTRAST TECHNIQUE: Multiplanar, multiecho pulse sequences of the brain and surrounding structures were obtained without intravenous contrast. COMPARISON:  No prior MRI available, correlation is made with 01/15/2004 CT head FINDINGS: Brain: No restricted diffusion to  suggest acute or subacute infarct. No acute hemorrhage, mass, mass effect, or midline shift. No hydrocephalus or extra-axial collection. Pituitary and craniocervical junction within normal limits. Punctate foci of hemosiderin deposition in the pons and left corona radiata, likely sequela of prior hypertensive microhemorrhages. Confluent T2 hyperintense signal in the periventricular white matter, likely the sequela of severe chronic small vessel ischemic disease. 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 Vascular: Loss of the left vertebral artery flow void. Otherwise normal arterial flow voids. Skull and upper cervical spine:  Normal marrow signal. Sinuses/Orbits: Mucosal thickening right frontal sinus. Status post bilateral lens replacements. Other: The mastoid air cells are well aerated. IMPRESSION: 1. No acute intracranial process. No evidence of acute or subacute infarct. 2. Loss of the left vertebral artery flow void, which is nonspecific but can be seen in the setting of slow flow or occlusion. If there is clinical concern for vertebral artery pathology, further evaluation with CTA head and neck is recommended. Electronically Signed   By: Wiliam Ke M.D.   On: 04/21/2023 18:54   DG Chest Portable 1 View Result Date: 04/21/2023 CLINICAL DATA:  Syncope, low oxygen saturation. EXAM: PORTABLE CHEST 1 VIEW COMPARISON:  09/08/2012 FINDINGS: Prior median sternotomy. Chronic cardiomegaly post CABG. Aortic atherosclerosis. Chronic interstitial coarsening. Chronic elevation of right hemidiaphragm. Left lung base opacity may represent pleural effusion and airspace disease/atelectasis. No pneumothorax. IMPRESSION: 1. Left lung base opacity may represent pleural effusion and airspace disease/atelectasis. 2. Chronic cardiomegaly post CABG. Electronically Signed   By: Narda Rutherford M.D.   On: 04/21/2023 17:20    Procedures Procedures  {Document cardiac monitor, telemetry assessment procedure when appropriate:1}  Medications Ordered in ED Medications  diltiazem (CARDIZEM) 125 mg in dextrose 5% 125 mL (1 mg/mL) infusion (5 mg/hr Intravenous Rate/Dose Verify 04/21/23 1825)  sodium chloride 0.9 % bolus 500 mL (0 mLs Intravenous Stopped 04/21/23 1733)    ED Course/ Medical Decision Making/ A&P   {  CRITICAL CARE Performed by: Bethann Berkshire Total critical care time: 40 minutes Critical care time was exclusive of separately billable procedures and treating other patients. Critical care was necessary to treat or prevent imminent or life-threatening deterioration. Critical care was time spent personally by me on the following  activities: development of treatment plan with patient and/or surrogate as well as nursing, discussions with consultants, evaluation of patient's response to treatment, examination of patient, obtaining history from patient or surrogate, ordering and performing treatments and interventions, ordering and review of laboratory studies, ordering and review of radiographic studies, pulse oximetry and re-evaluation of patient's condition.   Patient with atrial fibs with RVR.  She is started on a diltiazem drip Click here for ABCD2, HEART and other calculatorsREFRESH Note before signing :1}                              Medical Decision Making Amount and/or Complexity of Data Reviewed Labs: ordered. Radiology: ordered.  Risk Prescription drug management. Decision regarding hospitalization.   Syncopal episode with rapid atrial fibs.  Patient is admitted to medicine  {Document critical care time when appropriate:1} {Document review of labs and clinical decision tools ie heart score, Chads2Vasc2 etc:1}  {Document your independent review of radiology images, and any outside records:1} {Document your discussion with family members, caretakers, and with consultants:1} {Document social determinants of health affecting pt's care:1} {Document your decision making why or why not admission, treatments were needed:1} Final Clinical Impression(s) /  ED Diagnoses Final diagnoses:  Syncope and collapse  Atrial fibrillation with RVR (HCC)    Rx / DC Orders ED Discharge Orders     None

## 2023-04-21 NOTE — ED Triage Notes (Signed)
Patient arrives to triage via EMS. Patient had a syncopal episode after getting out of shower.  Family describes encounter as patient slumping over and started to drool, and lip turned blue.  Respirations even and unlabored during triage.  Patient oriented to baseline during triage.    Hx of afib and dementia.

## 2023-04-21 NOTE — ED Notes (Signed)
Report given to ICU RN.  All questions answered.

## 2023-04-21 NOTE — ED Notes (Signed)
Pt taken off 02 and was 89/90% ra. Pt in no resp distress or labored breathing noted. 02 placed back on at 2L [place].

## 2023-04-21 NOTE — ED Notes (Signed)
Lab at bedside

## 2023-04-21 NOTE — H&P (Signed)
History and Physical    Patient: Hannah Estes NFA:[id number] DOB: June 18, 1925 DOA: 04/21/2023 DOS: the patient was seen and examined on 04/21/2023 PCP: Kirstie Peri, MD  Patient coming from: Home  Chief Complaint:  Chief Complaint  Patient presents with   Loss of Consciousness   HPI: Hannah Estes is a 89 y.o. female with medical history significant of A fib, COPD, Carotid artery disease, h/o MI.  She was brought in via EMS after syncopal episode when getting out of the shower.  She is slumped over, started to drool and had cyanosis of the lips.  The patient recalls nothing of the event and her first memory is waking in the ambulance.  She otherwise feels well and is without chest pain, shortness of breath, abdominal pain, nausea, vomiting, fevers, chills.  Review of Systems: As mentioned in the history of present illness. All other systems reviewed and are negative. Past Medical History:  Diagnosis Date   Carotid artery disease (HCC)    COPD (chronic obstructive pulmonary disease) (HCC)    Coronary artery disease    Multivessel, LVEF 65%   GERD (gastroesophageal reflux disease)    Hyperlipidemia    Myocardial infarction (HCC)    NSTEMI 6/09   Past Surgical History:  Procedure Laterality Date   CATARACT EXTRACTION W/PHACO  03/05/2012   Procedure: CATARACT EXTRACTION PHACO AND INTRAOCULAR LENS PLACEMENT (IOC);  Surgeon: Gemma Payor, MD;  Location: [facility];  Service: Ophthalmology;  Laterality: Left;  CDE 25.71   CATARACT EXTRACTION W/PHACO  03/19/2012   Procedure: CATARACT EXTRACTION PHACO AND INTRAOCULAR LENS PLACEMENT (IOC);  Surgeon: Gemma Payor, MD;  Location: [facility];  Service: Ophthalmology;  Laterality: Right;  CDE:24.94   CORONARY ARTERY BYPASS GRAFT  03/29/2007   Off pump LIMA to LAD, SVG to first obtuse marginal , SVG to PDA     Social History:  reports that she quit smoking about 26 years ago. Her smoking use included cigarettes. She started smoking about 66 years ago.  She has a 16 pack-year smoking history. She has never used smokeless tobacco. She reports that she does not drink alcohol and does not use drugs.  Allergies  Allergen Reactions   Levaquin [Levofloxacin In D5w] Hives   Penicillins Hives    History reviewed. No pertinent family history.  Prior to Admission medications   Medication Sig Start Date End Date Taking? Authorizing Provider  diltiazem (CARDIZEM CD) 180 MG 24 hr capsule TAKE 1 CAPSULE BY MOUTH ONCE DAILY. 10/27/17   Laqueta Linden, MD  ELIQUIS 5 MG TABS tablet TAKE 1 TABLET BY MOUTH TWICE DAILY. 01/31/18   Jonelle Sidle, MD  lisinopril (PRINIVIL,ZESTRIL) 10 MG tablet Take 10 mg by mouth daily.    [provider]  metoprolol tartrate (LOPRESSOR) 25 MG tablet Take 0.5 tablets (12.5 mg total) by mouth 2 (two) times daily. 04/26/19   Antoine Poche, MD  Multiple Vitamins-Minerals (MULTIVITAMIN WITH MINERALS) tablet Take 1 tablet by mouth every other day.     [provider]  nitroGLYCERIN (NITROSTAT) 0.4 MG SL tablet Place 0.4 mg under the tongue every 5 (five) minutes as needed. Chest pain. 11/25/10   Jonelle Sidle, MD  Omega-3 Fatty Acids (FISH OIL) 1200 MG CAPS Take 2 capsules by mouth 2 (two) times daily. OTC medication, 1200 mg capsule, only delivers 900 mg of fish oil, called a mini capsule    [provider]    Physical Exam: Vitals:   04/21/23 1915 04/21/23 1920  04/21/23 1925 04/21/23 1938  BP: 121/73 121/67 112/68   Pulse: 82 95 94   Resp: 18 17 16    Temp:    (!) 97.5 F (36.4 C)  TempSrc:    Axillary  SpO2: 95% 90% 95%    General: Elderly female. Awake and alert and oriented x3. No acute cardiopulmonary distress.  HEENT: Normocephalic atraumatic.  Right and left ears normal in appearance.  Pupils equal, round, reactive to light. Extraocular muscles are intact. Sclerae anicteric and noninjected.  Moist mucosal membranes. No mucosal lesions.  Neck: Neck supple without  lymphadenopathy. No carotid bruits. No masses palpated.  Cardiovascular: Irregularly irregular rate. No murmurs, rubs, gallops auscultated. No JVD.  Respiratory: Good respiratory effort with no wheezes, rales, rhonchi. Lungs clear to auscultation bilaterally.  No accessory muscle use. Abdomen: Soft, nontender, nondistended. Active bowel sounds. No masses or hepatosplenomegaly  Skin: No rashes, lesions, or ulcerations.  Dry, warm to touch. 2+ dorsalis pedis and radial pulses. Musculoskeletal: No calf or leg pain. All major joints not erythematous nontender.  No upper or lower joint deformation.  Good ROM.  No contractures  Psychiatric: Intact judgment and insight. Pleasant and cooperative. Neurologic: No focal neurological deficits. Strength is 5/5 and symmetric in upper and lower extremities.  Cranial nerves II through XII are grossly intact.  Data Reviewed: Results for orders placed or performed during the hospital encounter of 04/21/23 (from the past 24 hours)  Urinalysis, Routine w reflex microscopic -Urine, Clean Catch     Status: Abnormal   Collection Time: 04/21/23  3:11 PM  Result Value Ref Range   Color, Urine AMBER (A) YELLOW   APPearance HAZY (A) CLEAR   Specific Gravity, Urine 1.018 1.005 - 1.030   pH 6.0 5.0 - 8.0   Glucose, UA NEGATIVE NEGATIVE mg/dL   Hgb urine dipstick NEGATIVE NEGATIVE   Bilirubin Urine NEGATIVE NEGATIVE   Ketones, ur NEGATIVE NEGATIVE mg/dL   Protein, ur NEGATIVE NEGATIVE mg/dL   Nitrite NEGATIVE NEGATIVE   Leukocytes,Ua NEGATIVE NEGATIVE  Basic metabolic panel     Status: Abnormal   Collection Time: 04/21/23  3:40 PM  Result Value Ref Range   Sodium 142 135 - 145 mmol/L   Potassium 4.3 3.5 - 5.1 mmol/L   Chloride 107 98 - 111 mmol/L   CO2 24 22 - 32 mmol/L   Glucose, Bld 138 (H) 70 - 99 mg/dL   BUN 19 8 - 23 mg/dL   Creatinine, Ser 1.61 0.44 - 1.00 mg/dL   Calcium 8.4 (L) 8.9 - 10.3 mg/dL   GFR, Estimated >09 >60 mL/min   Anion gap 11 5 - 15   CBC     Status: Abnormal   Collection Time: 04/21/23  3:40 PM  Result Value Ref Range   WBC 15.4 (H) 4.0 - 10.5 K/uL   RBC 4.33 3.87 - 5.11 MIL/uL   Hemoglobin 13.1 12.0 - 15.0 g/dL   HCT 45.4 09.8 - 11.9 %   MCV 93.8 80.0 - 100.0 fL   MCH 30.3 26.0 - 34.0 pg   MCHC 32.3 30.0 - 36.0 g/dL   RDW 14.7 (H) 82.9 - 56.2 %   Platelets 210 150 - 400 K/uL   nRBC 0.0 0.0 - 0.2 %  Troponin I (High Sensitivity)     Status: None   Collection Time: 04/21/23  3:40 PM  Result Value Ref Range   Troponin I (High Sensitivity) 7 <18 ng/L  CBG monitoring, ED     Status: Abnormal  Collection Time: 04/21/23  3:47 PM  Result Value Ref Range   Glucose-Capillary 136 (H) 70 - 99 mg/dL  Troponin I (High Sensitivity)     Status: None   Collection Time: 04/21/23  6:33 PM  Result Value Ref Range   Troponin I (High Sensitivity) 7 <18 ng/L    MR BRAIN WO CONTRAST Result Date: 04/21/2023 CLINICAL DATA:  Mental status change, unknown cause EXAM: MRI HEAD WITHOUT CONTRAST TECHNIQUE: Multiplanar, multiecho pulse sequences of the brain and surrounding structures were obtained without intravenous contrast. COMPARISON:  No prior MRI available, correlation is made with 01/15/2004 CT head FINDINGS: Brain: No restricted diffusion to suggest acute or subacute infarct. No acute hemorrhage, mass, mass effect, or midline shift. No hydrocephalus or extra-axial collection. Pituitary and craniocervical junction within normal limits. Punctate foci of hemosiderin deposition in the pons and left corona radiata, likely sequela of prior hypertensive microhemorrhages. Confluent T2 hyperintense signal in the periventricular white matter, likely the sequela of severe chronic small vessel ischemic disease. 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 Vascular: Loss of the left vertebral artery flow void. Otherwise normal arterial flow voids. Skull and upper cervical spine: Normal marrow signal. Sinuses/Orbits: Mucosal thickening  right frontal sinus. Status post bilateral lens replacements. Other: The mastoid air cells are well aerated. IMPRESSION: 1. No acute intracranial process. No evidence of acute or subacute infarct. 2. Loss of the left vertebral artery flow void, which is nonspecific but can be seen in the setting of slow flow or occlusion. If there is clinical concern for vertebral artery pathology, further evaluation with CTA head and neck is recommended. Electronically Signed   By: Wiliam Ke M.D.   On: 04/21/2023 18:54   DG Chest Portable 1 View Result Date: 04/21/2023 CLINICAL DATA:  Syncope, low oxygen saturation. EXAM: PORTABLE CHEST 1 VIEW COMPARISON:  09/08/2012 FINDINGS: Prior median sternotomy. Chronic cardiomegaly post CABG. Aortic atherosclerosis. Chronic interstitial coarsening. Chronic elevation of right hemidiaphragm. Left lung base opacity may represent pleural effusion and airspace disease/atelectasis. No pneumothorax. IMPRESSION: 1. Left lung base opacity may represent pleural effusion and airspace disease/atelectasis. 2. Chronic cardiomegaly post CABG. Electronically Signed   By: Narda Rutherford M.D.   On: 04/21/2023 17:20     Assessment and Plan: No notes have been filed under this hospital service. Service: Hospitalist  Principal Problem:   Atrial fibrillation with RVR (HCC) Active Problems:   Mixed hyperlipidemia   Carotid artery occlusion   Other specified chronic obstructive pulmonary disease (HCC)   Syncopal episodes  Atrial fibrillation with RVR Cardizem drip Will transition to oral medicines when meets criteria Continue metoprolol Continue Eliquis Syncopal episode Continue monitoring Will get echo tomorrow MRI negative for stroke Carotid artery occlusion Hyperlipidemia   Advance Care Planning:   Code Status: Full Code confirmed by patient  Consults: None  Family Communication: None  Severity of Illness: The appropriate patient status for this patient is OBSERVATION.  Observation status is judged to be reasonable and necessary in order to provide the required intensity of service to ensure the patient's safety. The patient's presenting symptoms, physical exam findings, and initial radiographic and laboratory data in the context of their medical condition is felt to place them at decreased risk for further clinical deterioration. Furthermore, it is anticipated that the patient will be medically stable for discharge from the hospital within 2 midnights of admission.   Author: Levie Heritage, DO 04/21/2023 7:56 PM  For on call review [email or web address].

## 2023-04-22 ENCOUNTER — Other Ambulatory Visit (HOSPITAL_COMMUNITY): Payer: Self-pay | Admitting: Radiology

## 2023-04-22 ENCOUNTER — Observation Stay (HOSPITAL_BASED_OUTPATIENT_CLINIC_OR_DEPARTMENT_OTHER): Payer: Medicare Other

## 2023-04-22 DIAGNOSIS — I4891 Unspecified atrial fibrillation: Secondary | ICD-10-CM

## 2023-04-22 LAB — ECHOCARDIOGRAM COMPLETE
AR max vel: 1.12 cm2
AV Area VTI: 1.09 cm2
AV Area mean vel: 1.09 cm2
AV Mean grad: 1.7 mm[Hg]
AV Peak grad: 2.9 mm[Hg]
Ao pk vel: 0.85 m/s
Area-P 1/2: 7.66 cm2
Calc EF: 60.3 %
Height: 66 in
S' Lateral: 3.1 cm
Single Plane A2C EF: 50.3 %
Single Plane A4C EF: 68.6 %
Weight: 2042.34 [oz_av]

## 2023-04-22 LAB — MRSA NEXT GEN BY PCR, NASAL: MRSA by PCR Next Gen: NOT DETECTED

## 2023-04-22 MED ORDER — DILTIAZEM HCL ER COATED BEADS 180 MG PO CP24
180.0000 mg | ORAL_CAPSULE | Freq: Every day | ORAL | Status: DC
  Administered 2023-04-22 – 2023-04-23 (×2): 180 mg via ORAL
  Filled 2023-04-22 (×2): qty 1

## 2023-04-22 MED ORDER — PERFLUTREN LIPID MICROSPHERE
1.0000 mL | INTRAVENOUS | Status: AC | PRN
  Administered 2023-04-22: 3 mL via INTRAVENOUS

## 2023-04-22 NOTE — Progress Notes (Signed)
PROGRESS NOTE    [name]  XBM:[id number] DOB: 03-20-1926 DOA: 04/21/2023 PCP: Kirstie Peri, MD   Brief Narrative:    Hannah Estes is a 89 y.o. female with medical history significant of A fib, COPD, Carotid artery disease, h/o MI.  She was brought in via EMS after syncopal episode when getting out of the shower.  She was admitted for evaluation of syncopal episode and was noted to have atrial fibrillation with RVR requiring Cardizem drip.  Heart rates are now well-controlled and she has been transitioned to oral medications.  2D echocardiogram ordered and pending.  She is noted to have significant carotid and vertebral artery disease which is likely contributing to her symptoms.  Assessment & Plan:   Principal Problem:   Atrial fibrillation with RVR (HCC) Active Problems:   Mixed hyperlipidemia   Carotid artery occlusion   Other specified chronic obstructive pulmonary disease (HCC)   Syncopal episodes  Assessment and Plan:   Atrial fibrillation with RVR-resolved Cardizem drip weaned off and on oral Cardizem Continue metoprolol Continue Eliquis Transfer to telemetry Syncopal episode possibly related to above  Continue monitoring Echocardiogram pending MRI negative for stroke Follow precautions and PT evaluation, possible need for SNF Carotid artery occlusion with vertebral artery disease Hyperlipidemia    DVT prophylaxis: Eliquis Code Status: Full Family Communication: Discussed with legal guardian on phone 1/25 Disposition Plan:  Status is: Observation The patient will require care spanning > 2 midnights and should be moved to inpatient because: Need for IV medications and close observation.   Consultants:  None  Procedures:  None  Antimicrobials:  None   Subjective: Patient seen and evaluated today with no new acute complaints or concerns. No acute concerns or events noted overnight.  Heart rates have improved and she is off Cardizem  drip.  Objective: Vitals:   04/22/23 0800 04/22/23 0845 04/22/23 0850 04/22/23 1021  BP:  (!) 159/86 (!) 159/86 (!) 140/87  Pulse:  94 61 89  Resp:    20  Temp:    98.2 F (36.8 C)  TempSrc:    Axillary  SpO2: 96%  99% 100%  Weight:      Height:        Intake/Output Summary (Last 24 hours) at 04/22/2023 1239 Last data filed at 04/22/2023 0947 Gross per 24 hour  Intake 265.99 ml  Output 300 ml  Net -34.01 ml   Filed Weights   04/21/23 2100 04/22/23 0546  Weight: 57.1 kg 57.9 kg    Examination:  General exam: Appears calm and comfortable, hard of hearing Respiratory system: Clear to auscultation. Respiratory effort normal. Cardiovascular system: S1 & S2 heard, RRR.  Gastrointestinal system: Abdomen is soft Central nervous system: Alert and awake Extremities: No edema Skin: No significant lesions noted Psychiatry: Flat affect.    Data Reviewed: I have personally reviewed following labs and imaging studies  CBC: Recent Labs  Lab 04/21/23 1540  WBC 15.4*  HGB 13.1  HCT 40.6  MCV 93.8  PLT 210   Basic Metabolic Panel: Recent Labs  Lab 04/21/23 1540  NA 142  K 4.3  CL 107  CO2 24  GLUCOSE 138*  BUN 19  CREATININE 0.73  CALCIUM 8.4*   GFR: Estimated Creatinine Clearance: 38.4 mL/min (by C-G formula based on SCr of 0.73 mg/dL). Liver Function Tests: No results for input(s): "AST", "ALT", "ALKPHOS", "BILITOT", "PROT", "ALBUMIN" in the last 168 hours. No results for input(s): "LIPASE", "AMYLASE" in the last 168 hours. No results  for input(s): "AMMONIA" in the last 168 hours. Coagulation Profile: No results for input(s): "INR", "PROTIME" in the last 168 hours. Cardiac Enzymes: No results for input(s): "CKTOTAL", "CKMB", "CKMBINDEX", "TROPONINI" in the last 168 hours. BNP (last 3 results) No results for input(s): "PROBNP" in the last 8760 hours. HbA1C: No results for input(s): "HGBA1C" in the last 72 hours. CBG: Recent Labs  Lab 04/21/23 1547   GLUCAP 136*   Lipid Profile: No results for input(s): "CHOL", "HDL", "LDLCALC", "TRIG", "CHOLHDL", "LDLDIRECT" in the last 72 hours. Thyroid Function Tests: No results for input(s): "TSH", "T4TOTAL", "FREET4", "T3FREE", "THYROIDAB" in the last 72 hours. Anemia Panel: No results for input(s): "VITAMINB12", "FOLATE", "FERRITIN", "TIBC", "IRON", "RETICCTPCT" in the last 72 hours. Sepsis Labs: No results for input(s): "PROCALCITON", "LATICACIDVEN" in the last 168 hours.  Recent Results (from the past 240 hours)  MRSA Next Gen by PCR, Nasal     Status: None   Collection Time: 04/21/23  8:04 PM   Specimen: Nasal Mucosa; Nasal Swab  Result Value Ref Range Status   MRSA by PCR Next Gen NOT DETECTED NOT DETECTED Final    Comment: (NOTE) The GeneXpert MRSA Assay (FDA approved for NASAL specimens only), is one component of a comprehensive MRSA colonization surveillance program. It is not intended to diagnose MRSA infection nor to guide or monitor treatment for MRSA infections. Test performance is not FDA approved in patients less than 24 years old. Performed at [facility], [address] [place], [place] [address]          Radiology Studies: MR BRAIN WO CONTRAST Result Date: 04/21/2023 CLINICAL DATA:  Mental status change, unknown cause EXAM: MRI HEAD WITHOUT CONTRAST TECHNIQUE: Multiplanar, multiecho pulse sequences of the brain and surrounding structures were obtained without intravenous contrast. COMPARISON:  No prior MRI available, correlation is made with 01/15/2004 CT head FINDINGS: Brain: No restricted diffusion to suggest acute or subacute infarct. No acute hemorrhage, mass, mass effect, or midline shift. No hydrocephalus or extra-axial collection. Pituitary and craniocervical junction within normal limits. Punctate foci of hemosiderin deposition in the pons and left corona radiata, likely sequela of prior hypertensive microhemorrhages. Confluent T2 hyperintense signal in the  periventricular white matter, likely the sequela of severe chronic small vessel ischemic disease. 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 Vascular: Loss of the left vertebral artery flow void. Otherwise normal arterial flow voids. Skull and upper cervical spine: Normal marrow signal. Sinuses/Orbits: Mucosal thickening right frontal sinus. Status post bilateral lens replacements. Other: The mastoid air cells are well aerated. IMPRESSION: 1. No acute intracranial process. No evidence of acute or subacute infarct. 2. Loss of the left vertebral artery flow void, which is nonspecific but can be seen in the setting of slow flow or occlusion. If there is clinical concern for vertebral artery pathology, further evaluation with CTA head and neck is recommended. Electronically Signed   By: Wiliam Ke M.D.   On: 04/21/2023 18:54   DG Chest Portable 1 View Result Date: 04/21/2023 CLINICAL DATA:  Syncope, low oxygen saturation. EXAM: PORTABLE CHEST 1 VIEW COMPARISON:  09/08/2012 FINDINGS: Prior median sternotomy. Chronic cardiomegaly post CABG. Aortic atherosclerosis. Chronic interstitial coarsening. Chronic elevation of right hemidiaphragm. Left lung base opacity may represent pleural effusion and airspace disease/atelectasis. No pneumothorax. IMPRESSION: 1. Left lung base opacity may represent pleural effusion and airspace disease/atelectasis. 2. Chronic cardiomegaly post CABG. Electronically Signed   By: Narda Rutherford M.D.   On: 04/21/2023 17:20  Scheduled Meds:  apixaban  5 mg Oral BID   Chlorhexidine Gluconate Cloth  6 each Topical Daily   diltiazem  180 mg Oral Daily   lisinopril  10 mg Oral Daily   metoprolol tartrate  12.5 mg Oral BID   Continuous Infusions:  diltiazem (CARDIZEM) infusion Stopped (04/21/23 2326)     LOS: 0 days    Time spent: 55 minutes    Yulisa Chirico Hoover Brunette, DO [facility]  If 7PM-7AM, please contact night-coverage [email or web address] 04/22/2023,  12:39 PM

## 2023-04-22 NOTE — Plan of Care (Signed)
  Problem: Education: Goal: Knowledge of General Education information will improve Description: Including pain rating scale, medication(s)/side effects and non-pharmacologic comfort measures Outcome: Progressing   Problem: Health Behavior/Discharge Planning: Goal: Ability to manage health-related needs will improve Outcome: Progressing   Problem: Clinical Measurements: Goal: Ability to maintain clinical measurements within normal limits will improve Outcome: Progressing Goal: Will remain free from infection Outcome: Progressing Goal: Diagnostic test results will improve Outcome: Progressing Goal: Respiratory complications will improve Outcome: Progressing Goal: Cardiovascular complication will be avoided Outcome: Progressing   Problem: Activity: Goal: Risk for activity intolerance will decrease Outcome: Progressing   Problem: Nutrition: Goal: Adequate nutrition will be maintained Outcome: Progressing   Problem: Coping: Goal: Level of anxiety will decrease Outcome: Progressing   Problem: Elimination: Goal: Will not experience complications related to bowel motility Outcome: Progressing Goal: Will not experience complications related to urinary retention Outcome: Progressing   Problem: Pain Managment: Goal: General experience of comfort will improve and/or be controlled Outcome: Progressing   Problem: Safety: Goal: Ability to remain free from injury will improve Outcome: Progressing   Problem: Education: Goal: Knowledge of disease or condition will improve Outcome: Progressing Goal: Understanding of medication regimen will improve Outcome: Progressing Goal: Individualized Educational Video(s) Outcome: Progressing   Problem: Activity: Goal: Ability to tolerate increased activity will improve Outcome: Progressing   Problem: Cardiac: Goal: Ability to achieve and maintain adequate cardiopulmonary perfusion will improve Outcome: Progressing   Problem: Health  Behavior/Discharge Planning: Goal: Ability to safely manage health-related needs after discharge will improve Outcome: Progressing

## 2023-04-22 NOTE — Progress Notes (Signed)
Echocardiogram 2D Echocardiogram has been performed.  Hannah Estes 04/22/2023, 9:29 AM

## 2023-04-22 NOTE — Progress Notes (Signed)
Standing orders for skin care ordered per protocol.

## 2023-04-23 DIAGNOSIS — I4891 Unspecified atrial fibrillation: Secondary | ICD-10-CM | POA: Diagnosis not present

## 2023-04-23 LAB — BASIC METABOLIC PANEL
Anion gap: 7 (ref 5–15)
BUN: 13 mg/dL (ref 8–23)
CO2: 23 mmol/L (ref 22–32)
Calcium: 8.1 mg/dL — ABNORMAL LOW (ref 8.9–10.3)
Chloride: 106 mmol/L (ref 98–111)
Creatinine, Ser: 0.56 mg/dL (ref 0.44–1.00)
GFR, Estimated: >60 mL/min (ref >60)
Glucose, Bld: 75 mg/dL (ref 70–99)
Potassium: 3.5 mmol/L (ref 3.5–5.1)
Sodium: 136 mmol/L (ref 135–145)

## 2023-04-23 LAB — CBC
HCT: 36.1 % (ref 36.0–46.0)
Hemoglobin: 11.3 g/dL — ABNORMAL LOW (ref 12.0–15.0)
MCH: 29.3 pg (ref 26.0–34.0)
MCHC: 31.3 g/dL (ref 30.0–36.0)
MCV: 93.5 fL (ref 80.0–100.0)
Platelets: 216 10*3/uL (ref 150–400)
RBC: 3.86 MIL/uL — ABNORMAL LOW (ref 3.87–5.11)
RDW: 16.3 % — ABNORMAL HIGH (ref 11.5–15.5)
WBC: 6.8 10*3/uL (ref 4.0–10.5)
nRBC: 0 % (ref 0.0–0.2)

## 2023-04-23 LAB — MAGNESIUM: Magnesium: 1.9 mg/dL (ref 1.7–2.4)

## 2023-04-23 MED ORDER — LISINOPRIL 10 MG PO TABS: 10.0000 mg | ORAL_TABLET | Freq: Every day | ORAL | 0 refills | Status: DC

## 2023-04-23 MED ORDER — DILTIAZEM HCL ER COATED BEADS 180 MG PO CP24: 180.0000 mg | ORAL_CAPSULE | Freq: Every day | ORAL | 0 refills | Status: DC

## 2023-04-23 MED ORDER — METOPROLOL TARTRATE 25 MG PO TABS: 12.5000 mg | ORAL_TABLET | Freq: Two times a day (BID) | ORAL | 2 refills | Status: DC

## 2023-04-23 MED ORDER — APIXABAN 2.5 MG PO TABS: 2.5000 mg | ORAL_TABLET | Freq: Two times a day (BID) | ORAL | 0 refills | Status: DC

## 2023-04-23 NOTE — Discharge Summary (Addendum)
Physician Discharge Summary  Hannah Estes ZOX:[id number] DOB: [birth year] DOA: 04/21/2023  PCP: Kirstie Peri, MD  Admit date: 04/21/2023  Discharge date: 04/23/2023  Admitted From:Home  Disposition:  Home  Recommendations for Outpatient Follow-up:  Follow up with PCP in 1-2 weeks Continue on home medications as listed below and follow-up with PCP Recommend outpatient palliative evaluation No further follow-up or treatment recommended for reduced LVEF and hypokinesis on echocardiogram or significant intracranial vessel disease given advanced age Consider discontinuation of Eliquis in the near future should she remain a high fall risk  Home Health: PT/OT  Equipment/Devices: None  Discharge Condition:Stable  CODE STATUS: Full  Diet recommendation: Heart Healthy  Brief/Interim Summary: Hannah Estes is a 89 y.o. female with medical history significant of A fib, COPD, Carotid artery disease, h/o MI.  She was brought in via EMS after syncopal episode when getting out of the shower.  She was admitted for evaluation of syncopal episode and was noted to have atrial fibrillation with RVR requiring Cardizem drip.  Heart rates are now well-controlled and she has been transitioned to oral medications.  2D echocardiogram as noted below with LVEF 40-45% and global hypokinesis.  She is noted to have significant carotid and vertebral artery disease which is likely contributing to her symptoms.  She is no longer symptomatic and heart rates have remained controlled.  She has had PT evaluation recommending home health services.  Plan is to continue medication management as previously ordered as further aggressive care is not indicated in her advanced age.  She is stable for discharge at this time.  See recommendations as above.  Discharge Diagnoses:  Principal Problem:   Atrial fibrillation with RVR (HCC) Active Problems:   Mixed hyperlipidemia   Carotid artery occlusion   Other specified chronic  obstructive pulmonary disease (HCC)   Syncopal episodes  Principal discharge diagnosis: Syncopal episode-multifactorial likely in the setting of atrial fibrillation with RVR with noted head and neck vessel disease along with noted cardiomyopathy.  Discharge Instructions  Discharge Instructions     Diet - low sodium heart healthy   Complete by: As directed    Increase activity slowly   Complete by: As directed    No wound care   Complete by: As directed       Allergies as of 04/23/2023       Reactions   Levaquin [levofloxacin In D5w] Hives   Penicillins Hives   Statins         Medication List     STOP taking these medications    Dilt-XR 120 MG 24 hr capsule Generic drug: diltiazem   metoprolol succinate 25 MG 24 hr tablet Commonly known as: TOPROL-XL       TAKE these medications    albuterol (2.5 MG/3ML) 0.083% nebulizer solution Commonly known as: PROVENTIL Take 2.5 mg by nebulization every 2 (two) hours as needed for wheezing or shortness of breath.   albuterol 108 (90 Base) MCG/ACT inhaler Commonly known as: VENTOLIN HFA Inhale 2 puffs into the lungs every 4 (four) hours as needed for wheezing.   apixaban 2.5 MG Tabs tablet Commonly known as: Eliquis Take 1 tablet (2.5 mg total) by mouth 2 (two) times daily. What changed:  medication strength how much to take   diltiazem 180 MG 24 hr capsule Commonly known as: CARDIZEM CD Take 1 capsule (180 mg total) by mouth daily.   Fish Oil 1200 MG Caps Take 2 capsules by mouth 2 (two) times daily. OTC medication,  1200 mg capsule, only delivers 900 mg of fish oil, called a mini capsule   Levoxyl 50 MCG tablet Generic drug: levothyroxine Take 50 mcg by mouth daily.   lisinopril 10 MG tablet Commonly known as: ZESTRIL Take 1 tablet (10 mg total) by mouth daily.   metoprolol tartrate 25 MG tablet Commonly known as: LOPRESSOR Take 0.5 tablets (12.5 mg total) by mouth 2 (two) times daily.   mirtazapine 15  MG tablet Commonly known as: REMERON Take 15 mg by mouth at bedtime.   multivitamin with minerals tablet Take 1 tablet by mouth every other day.   nitroGLYCERIN 0.4 MG SL tablet Commonly known as: NITROSTAT Place 0.4 mg under the tongue every 5 (five) minutes as needed. Chest pain.   pantoprazole 40 MG tablet Commonly known as: PROTONIX Take 40 mg by mouth daily.   traZODone 150 MG tablet Commonly known as: DESYREL Take 300 mg by mouth at bedtime as needed for sleep.        Follow-up Information     Kirstie Peri, MD. Schedule an appointment as soon as possible for a visit in 1 week(s).   Specialty: Internal Medicine Contact information: [address] [place] [address] [phone]         [facility], [facility] Follow up.   Specialty: Home Health Services Why: Will call to schedule first visit. Contact information: [address] [place] [address] [phone]                Allergies  Allergen Reactions   Levaquin [Levofloxacin In D5w] Hives   Penicillins Hives   Statins     Consultations: None   Procedures/Studies: ECHOCARDIOGRAM COMPLETE Result Date: 04/22/2023    ECHOCARDIOGRAM REPORT   Patient Name:   Hannah Estes Date of Exam: 04/22/2023 Medical Rec #:  [id number]       Height:       66.0 in Accession #:    [id number]      Weight:       127.6 lb Date of Birth:  1925/11/19      BSA:          1.652 m Patient Age:    89 years        BP:           127/72 mmHg Patient Gender: F               HR:           104 bpm. [facility] Procedure: 2D Echo, Cardiac Doppler, Color Doppler and Intracardiac            Opacification Agent Indications:    Atrial Fibrillation  History:        Patient has prior history of Echocardiogram examinations, most                 recent 04/06/2016. Risk Factors:Dyslipidemia and Former Smoker.  Sonographer:    Karma Ganja Referring Phys: [id number] JACOB J STINSON  Sonographer Comments: Technically  challenging study due to limited acoustic windows. Image acquisition challenging due to patient body habitus and Image acquisition challenging due to uncooperative patient. IMPRESSIONS  1. Left ventricular ejection fraction, by estimation, is 40 to 45%. The left ventricle has mildly decreased function. The left ventricle demonstrates global hypokinesis. Left ventricular diastolic parameters are indeterminate.  2. Right ventricular systolic function is normal. The right ventricular size is normal. There is moderately elevated pulmonary artery systolic pressure.  3.  Left atrial size was severely dilated.  4. Right atrial size was severely dilated.  5. The mitral valve is normal in structure. Mild mitral valve regurgitation. No evidence of mitral stenosis.  6. Tricuspid valve regurgitation is moderate.  7. The aortic valve is normal in structure. Aortic valve regurgitation is not visualized. Aortic valve sclerosis/calcification is present, without any evidence of aortic stenosis.  8. The inferior vena cava is normal in size with greater than 50% respiratory variability, suggesting right atrial pressure of 3 mmHg. FINDINGS  Left Ventricle: Left ventricular ejection fraction, by estimation, is 40 to 45%. The left ventricle has mildly decreased function. The left ventricle demonstrates global hypokinesis. Definity contrast agent was given IV to delineate the left ventricular  endocardial borders. The left ventricular internal cavity size was normal in size. There is no left ventricular hypertrophy. Left ventricular diastolic parameters are indeterminate. Right Ventricle: The right ventricular size is normal. No increase in right ventricular wall thickness. Right ventricular systolic function is normal. There is moderately elevated pulmonary artery systolic pressure. The tricuspid regurgitant velocity is 3.45 m/s, and with an assumed right atrial pressure of 3 mmHg, the estimated right ventricular systolic pressure is 50.6  mmHg. Left Atrium: Left atrial size was severely dilated. Right Atrium: Right atrial size was severely dilated. Pericardium: There is no evidence of pericardial effusion. Presence of epicardial fat layer. Mitral Valve: The mitral valve is normal in structure. Mild mitral annular calcification. Mild mitral valve regurgitation. No evidence of mitral valve stenosis. Tricuspid Valve: The tricuspid valve is normal in structure. Tricuspid valve regurgitation is moderate . No evidence of tricuspid stenosis. Aortic Valve: The aortic valve is normal in structure. Aortic valve regurgitation is not visualized. Aortic valve sclerosis/calcification is present, without any evidence of aortic stenosis. Aortic valve mean gradient measures 1.7 mmHg. Aortic valve peak  gradient measures 2.9 mmHg. Aortic valve area, by VTI measures 1.09 cm. Pulmonic Valve: The pulmonic valve was normal in structure. Pulmonic valve regurgitation is mild. No evidence of pulmonic stenosis. Aorta: The aortic root is normal in size and structure. Venous: The inferior vena cava is normal in size with greater than 50% respiratory variability, suggesting right atrial pressure of 3 mmHg. IAS/Shunts: No atrial level shunt detected by color flow Doppler.  LEFT VENTRICLE PLAX 2D LVIDd:         3.90 cm     Diastology LVIDs:         3.10 cm     LV e' medial:    7.33 cm/s LV PW:         1.00 cm     LV E/e' medial:  12.6 LV IVS:        0.90 cm     LV e' lateral:   10.48 cm/s LVOT diam:     2.00 cm     LV E/e' lateral: 8.8 LV SV:         20 LV SV Index:   12 LVOT Area:     3.14 cm  LV Volumes (MOD) LV vol d, MOD A2C: 32.6 ml LV vol d, MOD A4C: 65.9 ml LV vol s, MOD A2C: 16.2 ml LV vol s, MOD A4C: 20.7 ml LV SV MOD A2C:     16.4 ml LV SV MOD A4C:     65.9 ml LV SV MOD BP:      29.8 ml RIGHT VENTRICLE            IVC RV Basal diam:  3.90 cm  IVC diam: 1.70 cm RV S prime:     7.00 cm/s TAPSE (M-mode): 1.5 cm LEFT ATRIUM              Index        RIGHT ATRIUM            Index LA diam:        4.30 cm  2.60 cm/m   RA Area:     24.00 cm LA Vol (A2C):   94.7 ml  57.31 ml/m  RA Volume:   74.20 ml  44.91 ml/m LA Vol (A4C):   122.0 ml 73.83 ml/m LA Biplane Vol: 119.0 ml 72.02 ml/m  AORTIC VALVE AV Area (Vmax):    1.12 cm AV Area (Vmean):   1.09 cm AV Area (VTI):     1.09 cm AV Vmax:           85.13 cm/s AV Vmean:          61.233 cm/s AV VTI:            0.182 m AV Peak Grad:      2.9 mmHg AV Mean Grad:      1.7 mmHg LVOT Vmax:         30.23 cm/s LVOT Vmean:        21.333 cm/s LVOT VTI:          0.063 m LVOT/AV VTI ratio: 0.35  AORTA Ao Root diam: 2.40 cm MITRAL VALVE               TRICUSPID VALVE MV Area (PHT): 7.66 cm    TR Peak grad:   47.6 mmHg MV Decel Time: 99 msec     TR Vmax:        345.00 cm/s MV E velocity: 92.10 cm/s                            SHUNTS                            Systemic VTI:  0.06 m                            Systemic Diam: 2.00 cm Kardie Tobb DO Electronically signed by Thomasene Ripple DO Signature Date/Time: 04/22/2023/12:53:57 PM    Final    MR BRAIN WO CONTRAST Result Date: 04/21/2023 CLINICAL DATA:  Mental status change, unknown cause EXAM: MRI HEAD WITHOUT CONTRAST TECHNIQUE: Multiplanar, multiecho pulse sequences of the brain and surrounding structures were obtained without intravenous contrast. COMPARISON:  No prior MRI available, correlation is made with 01/15/2004 CT head FINDINGS: Brain: No restricted diffusion to suggest acute or subacute infarct. No acute hemorrhage, mass, mass effect, or midline shift. No hydrocephalus or extra-axial collection. Pituitary and craniocervical junction within normal limits. Punctate foci of hemosiderin deposition in the pons and left corona radiata, likely sequela of prior hypertensive microhemorrhages. Confluent T2 hyperintense signal in the periventricular white matter, likely the sequela of severe chronic small vessel ischemic disease. 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 Vascular: Loss  of the left vertebral artery flow void. Otherwise normal arterial flow voids. Skull and upper cervical spine: Normal marrow signal. Sinuses/Orbits: Mucosal thickening right frontal sinus. Status post bilateral lens replacements. Other: The mastoid air cells are well aerated. IMPRESSION: 1. No acute intracranial process. No evidence of acute or subacute infarct.  2. Loss of the left vertebral artery flow void, which is nonspecific but can be seen in the setting of slow flow or occlusion. If there is clinical concern for vertebral artery pathology, further evaluation with CTA head and neck is recommended. Electronically Signed   By: Wiliam Ke M.D.   On: 04/21/2023 18:54   DG Chest Portable 1 View Result Date: 04/21/2023 CLINICAL DATA:  Syncope, low oxygen saturation. EXAM: PORTABLE CHEST 1 VIEW COMPARISON:  09/08/2012 FINDINGS: Prior median sternotomy. Chronic cardiomegaly post CABG. Aortic atherosclerosis. Chronic interstitial coarsening. Chronic elevation of right hemidiaphragm. Left lung base opacity may represent pleural effusion and airspace disease/atelectasis. No pneumothorax. IMPRESSION: 1. Left lung base opacity may represent pleural effusion and airspace disease/atelectasis. 2. Chronic cardiomegaly post CABG. Electronically Signed   By: Narda Rutherford M.D.   On: 04/21/2023 17:20     Discharge Exam: Vitals:   04/22/23 2137 04/23/23 0228  BP: (!) 140/65 136/79  Pulse: 71 65  Resp:  18  Temp: 97.7 F (36.5 C) 98 F (36.7 C)  SpO2: 92% 92%   Vitals:   04/22/23 1850 04/22/23 2106 04/22/23 2137 04/23/23 0228  BP: 132/71 132/71 (!) 140/65 136/79  Pulse: 82 82 71 65  Resp: 18   18  Temp:   97.7 F (36.5 C) 98 F (36.7 C)  TempSrc:   Axillary Axillary  SpO2: 94%  92% 92%  Weight:      Height:        General: Pt is alert, awake, not in acute distress Cardiovascular: RRR, S1/S2 +, no rubs, no gallops Respiratory: CTA bilaterally, no wheezing, no rhonchi Abdominal: Soft, NT, ND,  bowel sounds + Extremities: no edema, no cyanosis    The results of significant diagnostics from this hospitalization (including imaging, microbiology, ancillary and laboratory) are listed below for reference.     Microbiology: Recent Results (from the past 240 hours)  MRSA Next Gen by PCR, Nasal     Status: None   Collection Time: 04/21/23  8:04 PM   Specimen: Nasal Mucosa; Nasal Swab  Result Value Ref Range Status   MRSA by PCR Next Gen NOT DETECTED NOT DETECTED Final    Comment: (NOTE) The GeneXpert MRSA Assay (FDA approved for NASAL specimens only), is one component of a comprehensive MRSA colonization surveillance program. It is not intended to diagnose MRSA infection nor to guide or monitor treatment for MRSA infections. Test performance is not FDA approved in patients less than 17 years old. Performed at [facility], [address] [place], [place] [address]      Labs: BNP (last 3 results) No results for input(s): "BNP" in the last 8760 hours. Basic Metabolic Panel: Recent Labs  Lab 04/21/23 1540 04/23/23 0502  NA 142 136  K 4.3 3.5  CL 107 106  CO2 24 23  GLUCOSE 138* 75  BUN 19 13  CREATININE 0.73 0.56  CALCIUM 8.4* 8.1*  MG  --  1.9   Liver Function Tests: No results for input(s): "AST", "ALT", "ALKPHOS", "BILITOT", "PROT", "ALBUMIN" in the last 168 hours. No results for input(s): "LIPASE", "AMYLASE" in the last 168 hours. No results for input(s): "AMMONIA" in the last 168 hours. CBC: Recent Labs  Lab 04/21/23 1540 04/23/23 0502  WBC 15.4* 6.8  HGB 13.1 11.3*  HCT 40.6 36.1  MCV 93.8 93.5  PLT 210 216   Cardiac Enzymes: No results for input(s): "CKTOTAL", "CKMB", "CKMBINDEX", "TROPONINI" in the last 168 hours. BNP: Invalid input(s): "POCBNP" CBG: Recent  Labs  Lab 04/21/23 1547  GLUCAP 136*   D-Dimer No results for input(s): "DDIMER" in the last 72 hours. Hgb A1c No results for input(s): "HGBA1C" in the last 72 hours. Lipid Profile No  results for input(s): "CHOL", "HDL", "LDLCALC", "TRIG", "CHOLHDL", "LDLDIRECT" in the last 72 hours. Thyroid function studies No results for input(s): "TSH", "T4TOTAL", "T3FREE", "THYROIDAB" in the last 72 hours.  Invalid input(s): "FREET3" Anemia work up No results for input(s): "VITAMINB12", "FOLATE", "FERRITIN", "TIBC", "IRON", "RETICCTPCT" in the last 72 hours. Urinalysis    Component Value Date/Time   COLORURINE AMBER (A) 04/21/2023 1511   APPEARANCEUR HAZY (A) 04/21/2023 1511   LABSPEC 1.018 04/21/2023 1511   PHURINE 6.0 04/21/2023 1511   GLUCOSEU NEGATIVE 04/21/2023 1511   HGBUR NEGATIVE 04/21/2023 1511   BILIRUBINUR NEGATIVE 04/21/2023 1511   KETONESUR NEGATIVE 04/21/2023 1511   PROTEINUR NEGATIVE 04/21/2023 1511   UROBILINOGEN 1.0 09/07/2007 0414   NITRITE NEGATIVE 04/21/2023 1511   LEUKOCYTESUR NEGATIVE 04/21/2023 1511   Sepsis Labs Recent Labs  Lab 04/21/23 1540 04/23/23 0502  WBC 15.4* 6.8   Microbiology Recent Results (from the past 240 hours)  MRSA Next Gen by PCR, Nasal     Status: None   Collection Time: 04/21/23  8:04 PM   Specimen: Nasal Mucosa; Nasal Swab  Result Value Ref Range Status   MRSA by PCR Next Gen NOT DETECTED NOT DETECTED Final    Comment: (NOTE) The GeneXpert MRSA Assay (FDA approved for NASAL specimens only), is one component of a comprehensive MRSA colonization surveillance program. It is not intended to diagnose MRSA infection nor to guide or monitor treatment for MRSA infections. Test performance is not FDA approved in patients less than 36 years old. Performed at [facility], [address] [place], [place] [address]      Time coordinating discharge: 35 minutes  SIGNED:   Erick Blinks, DO [facility] 04/23/2023, 2:36 PM  If 7PM-7AM, please contact night-coverage [email or web address]

## 2023-04-23 NOTE — Evaluation (Signed)
Physical Therapy Evaluation Patient Details Name: Hannah Estes MRN: [id number] DOB: [birth year] Today's Date: 04/23/2023  History of Present Illness  Hannah Estes is a 89 y.o. female with medical history significant of A fib, COPD, Carotid artery disease, h/o MI.  She was brought in via EMS after syncopal episode when getting out of the shower.  She is slumped over, started to drool and had cyanosis of the lips.  The patient recalls nothing of the event and her first memory is waking in the ambulance.  She otherwise feels well and is without chest pain, shortness of breath, abdominal pain, nausea, vomiting, fevers, chills.    Clinical Impression  Patient lying in bed on therapist arrival.  Pleasant and agreeable to therapist assessment. Very heard of hearing.  Nurse Tech comes in and explains patient with dementia.  Patient needs min A for supine to sit to bring trunk upright. She is able to scoot herself to the edge of the bed.   Once sitting on the edge of the bed, she demonstrates good sitting balance.  Needs min A for boost up to standing to RW and she is then able to take a couple steps over to the chair with max cues.  patient left in chair with chair alarm set with call button in reach and nursing in the room.  Patient will benefit from continued skilled therapy services during the remainder of her hospital stay and at the next recommended venue of care to address deficits and promote return to optimal function.            If plan is discharge home, recommend the following: A little help with walking and/or transfers;A little help with bathing/dressing/bathroom;Assistance with cooking/housework;Help with stairs or ramp for entrance   Can travel by private vehicle        Equipment Recommendations None recommended by PT  Recommendations for Other Services       Functional Status Assessment Patient has had a recent decline in their functional status and demonstrates the ability to  make significant improvements in function in a reasonable and predictable amount of time.     Precautions / Restrictions Precautions Precautions: Fall Restrictions Weight Bearing Restrictions Per Provider Order: No      Mobility  Bed Mobility Overal bed mobility: Needs Assistance Bed Mobility: Supine to Sit     Supine to sit: Min assist     General bed mobility comments: min A for supine to sit with HOB slightly elevated Patient Response: Cooperative  Transfers Overall transfer level: Needs assistance Equipment used: Rolling walker (2 wheels)               General transfer comment: sit to stand with min A to RW    Ambulation/Gait Ambulation/Gait assistance: Min assist Gait Distance (Feet): 2 Feet Assistive device: Rolling walker (2 wheels) Gait Pattern/deviations: Trunk flexed       General Gait Details: decreased gait speed; needs max cues to take a couple steps to the chair to sit down  Stairs            Wheelchair Mobility     Tilt Bed Tilt Bed Patient Response: Cooperative  Modified Rankin (Stroke Patients Only)       Balance Overall balance assessment: Needs assistance Sitting-balance support: Bilateral upper extremity supported, Feet unsupported Sitting balance-Leahy Scale: Good Sitting balance - Comments: good sitting balance on edge of bed and chair   Standing balance support: Bilateral upper extremity supported, During functional activity, [place]  on assistive device for balance Standing balance-Leahy Scale: Fair Standing balance comment: fair to good standing balance with RW and PT CGA/min A                             Pertinent Vitals/Pain Pain Assessment Pain Assessment: No/denies pain    Home Living Family/patient expects to be discharged to:: Private residence Living Arrangements: Spouse/significant other;Non-relatives/Friends Available Help at Discharge: Family;Friend(s) Type of Home: Mobile home          Home Layout: One level        Prior Function Prior Level of Function : Needs assist;Patient poor historian/Family not available;Other (comment) (patient with dementia; nurse tech spoke with POA yesterday who is taking care of Hannah Estes; states they live in a mobile home and that Hannah Estes has "good days and bad days")             Mobility Comments: per nurse tech who spoke with POA patient is ambulatory at home with assist ADLs Comments: patient with dementia     Extremity/Trunk Assessment   Upper Extremity Assessment Upper Extremity Assessment: Generalized weakness    Lower Extremity Assessment Lower Extremity Assessment: Generalized weakness    Cervical / Trunk Assessment Cervical / Trunk Assessment: Kyphotic  Communication   Communication Communication: Hearing impairment Cueing Techniques: Verbal cues;Gestural cues;Tactile cues  Cognition Arousal: Alert   Overall Cognitive Status: History of cognitive impairments - at baseline                                 General Comments: history of dementia        General Comments      Exercises     Assessment/Plan    PT Assessment Patient needs continued PT services  PT Problem List Decreased strength;Decreased activity tolerance;Decreased balance;Decreased mobility;Decreased cognition       PT Treatment Interventions Gait training;Functional mobility training;Therapeutic activities;Therapeutic exercise;Patient/family education    PT Goals (Current goals can be found in the Care Plan section)  Acute Rehab PT Goals PT Goal Formulation: Patient unable to participate in goal setting    Frequency Min 2X/week     Co-evaluation               AM-PAC PT "6 Clicks" Mobility  Outcome Measure Help needed turning from your back to your side while in a flat bed without using bedrails?: A Little Help needed moving from lying on your back to sitting on the side of a flat bed without using  bedrails?: A Little Help needed moving to and from a bed to a chair (including a wheelchair)?: A Little Help needed standing up from a chair using your arms (e.g., wheelchair or bedside chair)?: A Little Help needed to walk in hospital room?: A Little Help needed climbing 3-5 steps with a railing? : A Lot 6 Click Score: 17    End of Session   Activity Tolerance: Patient tolerated treatment well Patient left: in chair;with nursing/sitter in room;with call bell/phone within reach;with chair alarm set Nurse Communication: Mobility status PT Visit Diagnosis: Unsteadiness on feet (R26.81);Muscle weakness (generalized) (M62.81);Other abnormalities of gait and mobility (R26.89)    Time: 1610-9604 PT Time Calculation (min) (ACUTE ONLY): 18 min   Charges:   PT Evaluation $PT Eval Low Complexity: 1 Low   PT General Charges $$ ACUTE PT VISIT: 1 Visit  9:01 AM, 04/23/23 Larna Capelle Small Amylee Lodato MPT [facility] [place] [phone]

## 2023-04-23 NOTE — Plan of Care (Signed)
  Problem: Acute Rehab PT Goals(only PT should resolve) Goal: Pt Will Go Supine/Side To Sit Outcome: Progressing Flowsheets (Taken 04/23/2023 0902) Pt will go Supine/Side to Sit: with contact guard assist Goal: Patient Will Transfer Sit To/From Stand Outcome: Progressing Flowsheets (Taken 04/23/2023 0902) Patient will transfer sit to/from stand: with contact guard assist Goal: Pt Will Transfer Bed To Chair/Chair To Bed Outcome: Progressing Flowsheets (Taken 04/23/2023 0902) Pt will Transfer Bed to Chair/Chair to Bed: with contact guard assist Goal: Pt Will Ambulate Outcome: Progressing Flowsheets (Taken 04/23/2023 0902) Pt will Ambulate:  50 feet  with contact guard assist  with rolling walker

## 2023-04-23 NOTE — TOC Transition Note (Addendum)
Transition of Care [facility]) - Discharge Note   Patient Details  Name: Hannah Estes MRN: [id number] Date of Birth: January 26, 1926  Transition of Care [facility]) CM/SW Contact:  Catalina Gravel, LCSW Phone Number: 04/23/2023, 12:18 PM   Clinical Narrative:    Pt recommended for HHPT /OT- MD shared recommendation son.  Provider sought in area. [facility] requested, response pending, TOC to follow.    AddendumFrances Furbish accepted. CSW added to AVS- No further TOC needs.    Final next level of care: Home w Home Health Services Barriers to Discharge: No Barriers Identified   Patient Goals and CMS Choice Patient states their goals for this hospitalization and ongoing recovery are:: Home woth PT/OT CMS Medicare.gov Compare Post Acute Care list provided to:: Patient Represenative (must comment) (Son Legal guardian) Choice offered to / list presented to : Adult Children [place] ownership interest in [facility].provided to:: Adult Children    Discharge Placement                       Discharge Plan and Services Additional resources added to the After Visit Summary for   In-house Referral: Clinical Social Work   Post Acute Care Choice: Home Health                    HH Arranged: PT, OT [facility] Agency: [facility] Date [facility] Agency Contacted: 04/23/23 Time HH Agency Contacted: [id number] Representative spoke with at [facility] Agency: Enrique Sack  Social Drivers of Health (SDOH) Interventions SDOH Screenings   Food Insecurity: Patient Unable To Answer (04/21/2023)  Housing: Patient Unable To Answer (04/21/2023)  Transportation Needs: Patient Unable To Answer (04/21/2023)  Utilities: Patient Unable To Answer (04/21/2023)  Financial Resource Strain: Low Risk  (09/12/2022)   Received from [facility]  Social Connections: Patient Unable To Answer (04/21/2023)  Tobacco Use: Medium Risk (04/21/2023)     Readmission Risk Interventions     No data to display

## 2023-04-23 NOTE — Care Management Obs Status (Signed)
MEDICARE OBSERVATION STATUS NOTIFICATION   Patient Details  Name: Hannah Estes MRN: [id number] Date of Birth: [birth year]   Medicare Observation Status Notification Given:  Yes    Elani Delph Marsh Dolly, LCSW 04/23/2023, 8:53 AM

## 2023-04-27 DIAGNOSIS — Z7901 Long term (current) use of anticoagulants: Secondary | ICD-10-CM | POA: Diagnosis not present

## 2023-04-27 DIAGNOSIS — Z881 Allergy status to other antibiotic agents status: Secondary | ICD-10-CM | POA: Diagnosis not present

## 2023-04-27 DIAGNOSIS — R001 Bradycardia, unspecified: Secondary | ICD-10-CM | POA: Diagnosis not present

## 2023-04-27 DIAGNOSIS — I110 Hypertensive heart disease with heart failure: Secondary | ICD-10-CM | POA: Diagnosis present

## 2023-04-27 DIAGNOSIS — I6523 Occlusion and stenosis of bilateral carotid arteries: Secondary | ICD-10-CM | POA: Diagnosis present

## 2023-04-27 DIAGNOSIS — J45909 Unspecified asthma, uncomplicated: Secondary | ICD-10-CM | POA: Diagnosis not present

## 2023-04-27 DIAGNOSIS — I4891 Unspecified atrial fibrillation: Secondary | ICD-10-CM | POA: Diagnosis not present

## 2023-04-27 DIAGNOSIS — I509 Heart failure, unspecified: Secondary | ICD-10-CM | POA: Diagnosis not present

## 2023-04-27 DIAGNOSIS — Z79890 Hormone replacement therapy (postmenopausal): Secondary | ICD-10-CM | POA: Diagnosis not present

## 2023-04-27 DIAGNOSIS — I5022 Chronic systolic (congestive) heart failure: Secondary | ICD-10-CM | POA: Diagnosis present

## 2023-04-27 DIAGNOSIS — J168 Pneumonia due to other specified infectious organisms: Secondary | ICD-10-CM | POA: Diagnosis not present

## 2023-04-27 DIAGNOSIS — I480 Paroxysmal atrial fibrillation: Secondary | ICD-10-CM | POA: Diagnosis not present

## 2023-04-27 DIAGNOSIS — Z79899 Other long term (current) drug therapy: Secondary | ICD-10-CM | POA: Diagnosis not present

## 2023-04-27 DIAGNOSIS — R262 Difficulty in walking, not elsewhere classified: Secondary | ICD-10-CM | POA: Diagnosis not present

## 2023-04-27 DIAGNOSIS — Z20822 Contact with and (suspected) exposure to covid-19: Secondary | ICD-10-CM | POA: Diagnosis not present

## 2023-04-27 DIAGNOSIS — I482 Chronic atrial fibrillation, unspecified: Secondary | ICD-10-CM | POA: Diagnosis not present

## 2023-04-27 DIAGNOSIS — I2510 Atherosclerotic heart disease of native coronary artery without angina pectoris: Secondary | ICD-10-CM | POA: Diagnosis present

## 2023-04-27 DIAGNOSIS — I672 Cerebral atherosclerosis: Secondary | ICD-10-CM | POA: Diagnosis not present

## 2023-04-27 DIAGNOSIS — J189 Pneumonia, unspecified organism: Secondary | ICD-10-CM | POA: Diagnosis not present

## 2023-04-27 DIAGNOSIS — R9389 Abnormal findings on diagnostic imaging of other specified body structures: Secondary | ICD-10-CM | POA: Diagnosis not present

## 2023-04-27 DIAGNOSIS — I5020 Unspecified systolic (congestive) heart failure: Secondary | ICD-10-CM | POA: Diagnosis not present

## 2023-04-27 DIAGNOSIS — I6782 Cerebral ischemia: Secondary | ICD-10-CM | POA: Diagnosis not present

## 2023-04-27 DIAGNOSIS — R059 Cough, unspecified: Secondary | ICD-10-CM | POA: Diagnosis not present

## 2023-04-27 DIAGNOSIS — I4821 Permanent atrial fibrillation: Secondary | ICD-10-CM | POA: Diagnosis present

## 2023-04-27 DIAGNOSIS — Z87891 Personal history of nicotine dependence: Secondary | ICD-10-CM | POA: Diagnosis not present

## 2023-04-27 DIAGNOSIS — M6281 Muscle weakness (generalized): Secondary | ICD-10-CM | POA: Diagnosis not present

## 2023-04-27 DIAGNOSIS — R54 Age-related physical debility: Secondary | ICD-10-CM | POA: Diagnosis present

## 2023-04-27 DIAGNOSIS — F0390 Unspecified dementia without behavioral disturbance: Secondary | ICD-10-CM | POA: Diagnosis not present

## 2023-04-27 DIAGNOSIS — I252 Old myocardial infarction: Secondary | ICD-10-CM | POA: Diagnosis not present

## 2023-04-27 DIAGNOSIS — Z66 Do not resuscitate: Secondary | ICD-10-CM | POA: Diagnosis present

## 2023-04-27 DIAGNOSIS — I5023 Acute on chronic systolic (congestive) heart failure: Secondary | ICD-10-CM | POA: Diagnosis not present

## 2023-04-27 DIAGNOSIS — F0280 Dementia in other diseases classified elsewhere without behavioral disturbance: Secondary | ICD-10-CM | POA: Diagnosis present

## 2023-04-27 DIAGNOSIS — I10 Essential (primary) hypertension: Secondary | ICD-10-CM | POA: Diagnosis not present

## 2023-04-27 DIAGNOSIS — Z880 Allergy status to penicillin: Secondary | ICD-10-CM | POA: Diagnosis not present

## 2023-04-27 DIAGNOSIS — R488 Other symbolic dysfunctions: Secondary | ICD-10-CM | POA: Diagnosis not present

## 2023-04-27 DIAGNOSIS — R638 Other symptoms and signs concerning food and fluid intake: Secondary | ICD-10-CM | POA: Diagnosis not present

## 2023-04-27 DIAGNOSIS — I700 Atherosclerosis of aorta: Secondary | ICD-10-CM | POA: Diagnosis not present

## 2023-04-27 DIAGNOSIS — Z792 Long term (current) use of antibiotics: Secondary | ICD-10-CM | POA: Diagnosis not present

## 2023-04-27 DIAGNOSIS — R7881 Bacteremia: Secondary | ICD-10-CM | POA: Diagnosis present

## 2023-04-27 DIAGNOSIS — R55 Syncope and collapse: Secondary | ICD-10-CM | POA: Diagnosis not present

## 2023-04-27 DIAGNOSIS — E785 Hyperlipidemia, unspecified: Secondary | ICD-10-CM | POA: Diagnosis present

## 2023-04-27 DIAGNOSIS — K219 Gastro-esophageal reflux disease without esophagitis: Secondary | ICD-10-CM | POA: Diagnosis not present

## 2023-04-27 DIAGNOSIS — E039 Hypothyroidism, unspecified: Secondary | ICD-10-CM | POA: Diagnosis not present

## 2023-04-27 DIAGNOSIS — Z1152 Encounter for screening for COVID-19: Secondary | ICD-10-CM | POA: Diagnosis not present

## 2023-04-28 DIAGNOSIS — R7881 Bacteremia: Secondary | ICD-10-CM | POA: Diagnosis present

## 2023-04-28 DIAGNOSIS — I5020 Unspecified systolic (congestive) heart failure: Secondary | ICD-10-CM | POA: Diagnosis not present

## 2023-04-28 DIAGNOSIS — R55 Syncope and collapse: Secondary | ICD-10-CM | POA: Diagnosis not present

## 2023-04-28 DIAGNOSIS — J45909 Unspecified asthma, uncomplicated: Secondary | ICD-10-CM | POA: Diagnosis not present

## 2023-04-28 DIAGNOSIS — K219 Gastro-esophageal reflux disease without esophagitis: Secondary | ICD-10-CM | POA: Diagnosis not present

## 2023-04-28 DIAGNOSIS — R262 Difficulty in walking, not elsewhere classified: Secondary | ICD-10-CM | POA: Diagnosis not present

## 2023-04-28 DIAGNOSIS — J168 Pneumonia due to other specified infectious organisms: Secondary | ICD-10-CM | POA: Diagnosis not present

## 2023-04-28 DIAGNOSIS — R488 Other symbolic dysfunctions: Secondary | ICD-10-CM | POA: Diagnosis not present

## 2023-04-28 DIAGNOSIS — Z880 Allergy status to penicillin: Secondary | ICD-10-CM | POA: Diagnosis not present

## 2023-04-28 DIAGNOSIS — Z7901 Long term (current) use of anticoagulants: Secondary | ICD-10-CM | POA: Diagnosis not present

## 2023-04-28 DIAGNOSIS — I10 Essential (primary) hypertension: Secondary | ICD-10-CM | POA: Diagnosis not present

## 2023-04-28 DIAGNOSIS — E785 Hyperlipidemia, unspecified: Secondary | ICD-10-CM | POA: Diagnosis present

## 2023-04-28 DIAGNOSIS — M6281 Muscle weakness (generalized): Secondary | ICD-10-CM | POA: Diagnosis not present

## 2023-04-28 DIAGNOSIS — J189 Pneumonia, unspecified organism: Secondary | ICD-10-CM | POA: Diagnosis present

## 2023-04-28 DIAGNOSIS — I482 Chronic atrial fibrillation, unspecified: Secondary | ICD-10-CM | POA: Diagnosis not present

## 2023-04-28 DIAGNOSIS — F0280 Dementia in other diseases classified elsewhere without behavioral disturbance: Secondary | ICD-10-CM | POA: Diagnosis present

## 2023-04-28 DIAGNOSIS — I509 Heart failure, unspecified: Secondary | ICD-10-CM | POA: Diagnosis not present

## 2023-04-28 DIAGNOSIS — R54 Age-related physical debility: Secondary | ICD-10-CM | POA: Diagnosis present

## 2023-04-28 DIAGNOSIS — Z792 Long term (current) use of antibiotics: Secondary | ICD-10-CM | POA: Diagnosis not present

## 2023-04-28 DIAGNOSIS — I2510 Atherosclerotic heart disease of native coronary artery without angina pectoris: Secondary | ICD-10-CM | POA: Diagnosis present

## 2023-04-28 DIAGNOSIS — Z79890 Hormone replacement therapy (postmenopausal): Secondary | ICD-10-CM | POA: Diagnosis not present

## 2023-04-28 DIAGNOSIS — Z79899 Other long term (current) drug therapy: Secondary | ICD-10-CM | POA: Diagnosis not present

## 2023-04-28 DIAGNOSIS — Z66 Do not resuscitate: Secondary | ICD-10-CM | POA: Diagnosis present

## 2023-04-28 DIAGNOSIS — I252 Old myocardial infarction: Secondary | ICD-10-CM | POA: Diagnosis not present

## 2023-04-28 DIAGNOSIS — Z1152 Encounter for screening for COVID-19: Secondary | ICD-10-CM | POA: Diagnosis not present

## 2023-04-28 DIAGNOSIS — F0390 Unspecified dementia without behavioral disturbance: Secondary | ICD-10-CM | POA: Diagnosis not present

## 2023-04-28 DIAGNOSIS — E039 Hypothyroidism, unspecified: Secondary | ICD-10-CM | POA: Diagnosis not present

## 2023-04-28 DIAGNOSIS — R059 Cough, unspecified: Secondary | ICD-10-CM | POA: Diagnosis not present

## 2023-04-28 DIAGNOSIS — I110 Hypertensive heart disease with heart failure: Secondary | ICD-10-CM | POA: Diagnosis present

## 2023-04-28 DIAGNOSIS — Z87891 Personal history of nicotine dependence: Secondary | ICD-10-CM | POA: Diagnosis not present

## 2023-04-28 DIAGNOSIS — I480 Paroxysmal atrial fibrillation: Secondary | ICD-10-CM | POA: Diagnosis not present

## 2023-04-28 DIAGNOSIS — I4821 Permanent atrial fibrillation: Secondary | ICD-10-CM | POA: Diagnosis present

## 2023-04-28 DIAGNOSIS — Z881 Allergy status to other antibiotic agents status: Secondary | ICD-10-CM | POA: Diagnosis not present

## 2023-04-28 DIAGNOSIS — I6523 Occlusion and stenosis of bilateral carotid arteries: Secondary | ICD-10-CM | POA: Diagnosis present

## 2023-04-28 DIAGNOSIS — R638 Other symptoms and signs concerning food and fluid intake: Secondary | ICD-10-CM | POA: Diagnosis not present

## 2023-04-28 DIAGNOSIS — I700 Atherosclerosis of aorta: Secondary | ICD-10-CM | POA: Diagnosis not present

## 2023-04-28 DIAGNOSIS — I5022 Chronic systolic (congestive) heart failure: Secondary | ICD-10-CM | POA: Diagnosis present

## 2023-04-28 DIAGNOSIS — R001 Bradycardia, unspecified: Secondary | ICD-10-CM | POA: Diagnosis not present

## 2023-04-28 DIAGNOSIS — I5023 Acute on chronic systolic (congestive) heart failure: Secondary | ICD-10-CM | POA: Diagnosis not present

## 2023-05-01 DIAGNOSIS — R262 Difficulty in walking, not elsewhere classified: Secondary | ICD-10-CM | POA: Diagnosis not present

## 2023-05-01 DIAGNOSIS — M6281 Muscle weakness (generalized): Secondary | ICD-10-CM | POA: Diagnosis not present

## 2023-05-01 DIAGNOSIS — R059 Cough, unspecified: Secondary | ICD-10-CM | POA: Diagnosis not present

## 2023-05-01 DIAGNOSIS — E785 Hyperlipidemia, unspecified: Secondary | ICD-10-CM | POA: Diagnosis not present

## 2023-05-01 DIAGNOSIS — F0390 Unspecified dementia without behavioral disturbance: Secondary | ICD-10-CM | POA: Diagnosis not present

## 2023-05-01 DIAGNOSIS — I482 Chronic atrial fibrillation, unspecified: Secondary | ICD-10-CM | POA: Diagnosis not present

## 2023-05-01 DIAGNOSIS — E039 Hypothyroidism, unspecified: Secondary | ICD-10-CM | POA: Diagnosis not present

## 2023-05-01 DIAGNOSIS — F32A Depression, unspecified: Secondary | ICD-10-CM | POA: Diagnosis not present

## 2023-05-01 DIAGNOSIS — R001 Bradycardia, unspecified: Secondary | ICD-10-CM | POA: Diagnosis not present

## 2023-05-01 DIAGNOSIS — R918 Other nonspecific abnormal finding of lung field: Secondary | ICD-10-CM | POA: Diagnosis not present

## 2023-05-01 DIAGNOSIS — K219 Gastro-esophageal reflux disease without esophagitis: Secondary | ICD-10-CM | POA: Diagnosis not present

## 2023-05-01 DIAGNOSIS — I509 Heart failure, unspecified: Secondary | ICD-10-CM | POA: Diagnosis not present

## 2023-05-01 DIAGNOSIS — I517 Cardiomegaly: Secondary | ICD-10-CM | POA: Diagnosis not present

## 2023-05-01 DIAGNOSIS — I480 Paroxysmal atrial fibrillation: Secondary | ICD-10-CM | POA: Diagnosis not present

## 2023-05-01 DIAGNOSIS — I10 Essential (primary) hypertension: Secondary | ICD-10-CM | POA: Diagnosis not present

## 2023-05-01 DIAGNOSIS — I4891 Unspecified atrial fibrillation: Secondary | ICD-10-CM | POA: Diagnosis not present

## 2023-05-01 DIAGNOSIS — J189 Pneumonia, unspecified organism: Secondary | ICD-10-CM | POA: Diagnosis not present

## 2023-05-01 DIAGNOSIS — J45909 Unspecified asthma, uncomplicated: Secondary | ICD-10-CM | POA: Diagnosis not present

## 2023-05-01 DIAGNOSIS — R488 Other symbolic dysfunctions: Secondary | ICD-10-CM | POA: Diagnosis not present

## 2023-05-01 DIAGNOSIS — I2510 Atherosclerotic heart disease of native coronary artery without angina pectoris: Secondary | ICD-10-CM | POA: Diagnosis not present

## 2023-05-01 DIAGNOSIS — I700 Atherosclerosis of aorta: Secondary | ICD-10-CM | POA: Diagnosis not present

## 2023-05-01 DIAGNOSIS — J90 Pleural effusion, not elsewhere classified: Secondary | ICD-10-CM | POA: Diagnosis not present

## 2023-05-01 DIAGNOSIS — R55 Syncope and collapse: Secondary | ICD-10-CM | POA: Diagnosis not present

## 2023-05-01 DIAGNOSIS — I495 Sick sinus syndrome: Secondary | ICD-10-CM | POA: Diagnosis not present

## 2023-05-01 DIAGNOSIS — R7881 Bacteremia: Secondary | ICD-10-CM | POA: Diagnosis not present

## 2023-05-01 DIAGNOSIS — I4729 Other ventricular tachycardia: Secondary | ICD-10-CM | POA: Diagnosis not present

## 2023-05-02 ENCOUNTER — Non-Acute Institutional Stay (SKILLED_NURSING_FACILITY): Payer: Self-pay | Admitting: Adult Health

## 2023-05-02 ENCOUNTER — Encounter: Payer: Self-pay | Admitting: Adult Health

## 2023-05-02 DIAGNOSIS — R55 Syncope and collapse: Secondary | ICD-10-CM

## 2023-05-02 DIAGNOSIS — I4891 Unspecified atrial fibrillation: Secondary | ICD-10-CM

## 2023-05-02 DIAGNOSIS — K219 Gastro-esophageal reflux disease without esophagitis: Secondary | ICD-10-CM | POA: Insufficient documentation

## 2023-05-02 DIAGNOSIS — I25118 Atherosclerotic heart disease of native coronary artery with other forms of angina pectoris: Secondary | ICD-10-CM | POA: Insufficient documentation

## 2023-05-02 DIAGNOSIS — E039 Hypothyroidism, unspecified: Secondary | ICD-10-CM | POA: Insufficient documentation

## 2023-05-02 DIAGNOSIS — J4489 Other specified chronic obstructive pulmonary disease: Secondary | ICD-10-CM

## 2023-05-02 DIAGNOSIS — F32A Depression, unspecified: Secondary | ICD-10-CM | POA: Insufficient documentation

## 2023-05-02 DIAGNOSIS — I700 Atherosclerosis of aorta: Secondary | ICD-10-CM | POA: Insufficient documentation

## 2023-05-02 DIAGNOSIS — J189 Pneumonia, unspecified organism: Secondary | ICD-10-CM | POA: Insufficient documentation

## 2023-05-02 NOTE — Progress Notes (Signed)
 Location:  [facility] Room Number: [id number] Place of Service:  SNF (31)   CODE STATUS: dnr   Allergies  Allergen Reactions   Levaquin [Levofloxacin In D5w] Hives   Penicillins Hives   Statins     Chief Complaint  Patient presents with   Hospitalization Follow-up    HPI:  She is a 89 year old woman who has been hospitalized from 04-27-23 through 05-01-23. Her past medical history includes: chronic atrial fibrillation; congestive heart failure; CAD with history of CABG; carotid artery disease. She had been hospitalized from 04-20-24 to 04-23-23 for syncope due to atrial fibrillation with RVR. She was discharged to home. On 04-27-23 she had another syncopal episode lasting 8 minutes. She was admitted and treated for right lower lobe pneumonia. She was treated with zithromax and rocephin. She is being discharged on zithromax and cefdinir through 05-06-23.  Bacteremia: gram negative rods in 1 of 2 culture sites. She did receive rocephin 2 gm daily. Wbc was not elevated. Repeated blood cultures were negative. She did have a Clostridium perfringens identified 2/3, contaminant in pt with no GI symptoms Syncope: echo on 04-22-23: EF 40-45%. 2 liter fluid restriction.  She is here for short term rehab with her goal to return back home. She does live her son and daughter in law. She will continue to be followed for her chronic illnesses including: Atrial fibrillation with RVR: Chronic obstructive pulmonary disease unspecified COPD type: Acquired hypothyroidism   Past Medical History:  Diagnosis Date   Carotid artery disease (HCC)    COPD (chronic obstructive pulmonary disease) (HCC)    Coronary artery disease    Multivessel, LVEF 65%   GERD (gastroesophageal reflux disease)    Hyperlipidemia    Myocardial infarction (HCC)    NSTEMI 6/09    Past Surgical History:  Procedure Laterality Date   CATARACT EXTRACTION W/PHACO  03/05/2012   Procedure: CATARACT EXTRACTION PHACO AND  INTRAOCULAR LENS PLACEMENT (IOC);  Surgeon: Cherene Mania, MD;  Location: [facility];  Service: Ophthalmology;  Laterality: Left;  CDE 25.71   CATARACT EXTRACTION W/PHACO  03/19/2012   Procedure: CATARACT EXTRACTION PHACO AND INTRAOCULAR LENS PLACEMENT (IOC);  Surgeon: Cherene Mania, MD;  Location: [facility];  Service: Ophthalmology;  Laterality: Right;  CDE:24.94   CORONARY ARTERY BYPASS GRAFT  03/29/2007   Off pump LIMA to LAD, SVG to first obtuse marginal , SVG to PDA      Social History   Socioeconomic History   Marital status: Widowed    Spouse name: Not on file   Number of children: Not on file   Years of education: Not on file   Highest education level: Not on file  Occupational History   Occupation: Retired  Tobacco Use   Smoking status: Former    Current packs/day: 0.00    Average packs/day: 0.4 packs/day for 40.0 years (16.0 ttl pk-yrs)    Types: Cigarettes    Start date: 03/28/1957    Quit date: 03/28/1997    Years since quitting: 26.1   Smokeless tobacco: Never   Tobacco comments:    smoked 4 cigarettes per day  Substance and Sexual Activity   Alcohol  use: No    Alcohol /week: 0.0 standard drinks of alcohol     Comment: occassionally   Drug use: No   Sexual activity: Yes    Birth control/protection: None  Other Topics Concern   Not on file  Social History Narrative   Not on file   Social Drivers  of Health   Financial Resource Strain: Patient Unable To Answer (04/27/2023)   Received from [facility]   Overall Financial Resource Strain (CARDIA)    Difficulty of Paying Living Expenses: Patient unable to answer  Food Insecurity: Patient Unable To Answer (04/27/2023)   Received from [facility]   Hunger Vital Sign    Worried About Running Out of Food in the Last Year: Patient unable to answer    Ran Out of Food in the Last Year: Patient unable to answer  Transportation Needs: No Transportation Needs (04/27/2023)   Received [facility] - Transportation     Lack of Transportation (Medical): No    Lack of Transportation (Non-Medical): No  Physical Activity: Patient Unable To Answer (04/27/2023)   Received from [facility]   Exercise Vital Sign    Days of Exercise per Week: Patient unable to answer    Minutes of Exercise per Session: Patient unable to answer  Stress: Patient Unable To Answer (04/27/2023)   Received from [facility] - Occupational Stress Questionnaire    Feeling of Stress : Patient unable to answer  Social Connections: Patient Unable To Answer (04/27/2023)   Received from [facility]   Social Connection and Isolation Panel [NHANES]    Frequency of Communication with Friends and Family: Patient unable to answer    Frequency of Social Gatherings with Friends and Family: Patient unable to answer    Attends Religious Services: Patient unable to answer    Active Member of Clubs or Organizations: Patient unable to answer    Attends Banker Meetings: Patient unable to answer    Marital Status: Patient unable to answer  Intimate Partner Violence: Patient Unable To Answer (04/27/2023)   Received from [facility]   Humiliation, Afraid, Rape, and Kick questionnaire    Fear of Current or Ex-Partner: Patient unable to answer    Emotionally Abused: Patient unable to answer    Physically Abused: Patient unable to answer    Sexually Abused: Patient unable to answer   No family history on file.    VITAL SIGNS BP 133/63   Pulse 83   Temp 97.9 F (36.6 C)   Resp 20   Ht 5' 6 (1.676 m)   Wt 136 lb (61.7 kg)   SpO2 93%   BMI 21.95 kg/m   Outpatient Encounter Medications as of 05/02/2023  Medication Sig   apixaban  (ELIQUIS ) 5 MG TABS tablet Take 5 mg by mouth 2 (two) times daily.   azithromycin (ZITHROMAX) 500 MG tablet Take 500 mg by mouth daily.   cefdinir (OMNICEF) 300 MG capsule Take 300 mg by mouth 2 (two) times daily.   diltiazem  (CARDIZEM  CD) 120 MG 24  hr capsule Take 120 mg by mouth daily.   guaiFENesin (MUCINEX) 600 MG 12 hr tablet Take 600 mg by mouth 2 (two) times daily.   levothyroxine  (SYNTHROID ) 50 MCG tablet Take 50 mcg by mouth daily before breakfast.   metoprolol  tartrate (LOPRESSOR ) 25 MG tablet Take 25 mg by mouth 2 (two) times daily.   trazodone  (DESYREL ) 300 MG tablet Take 300 mg by mouth at bedtime.   albuterol  (VENTOLIN  HFA) 108 (90 Base) MCG/ACT inhaler Inhale 2 puffs into the lungs in the morning, at noon, in the evening, and at bedtime.   mirtazapine  (REMERON ) 15 MG tablet Take 15 mg by mouth at bedtime.   nitroGLYCERIN  (NITROSTAT )  0.4 MG SL tablet Place 0.4 mg under the tongue every 5 (five) minutes as needed. Chest pain.   pantoprazole  (PROTONIX ) 40 MG tablet Take 40 mg by mouth daily.   Facility-Administered Encounter Medications as of 05/02/2023  Medication     SIGNIFICANT DIAGNOSTIC EXAMS  TODAY  04-21-23: wbc 15.4; hgb 13.1; hct 40.6; mcv 93.8 plt 210; glucose 138; bun 19; creat 0.73; k+ 4.3; na++ 142; ca 8.4; gfr >60 04-29-23: wbcc 5.5; hgb 11.8; hct 35.8; mcv 88.8 plt 261; glucose 98; bun 13; creat 0.72; k+ 3.9; na++ 144; ca 7.9; gfr 77.   Review of Systems  Constitutional:  Negative for malaise/fatigue.  Respiratory:  Negative for cough and shortness of breath.   Cardiovascular:  Negative for chest pain, palpitations and leg swelling.  Gastrointestinal:  Negative for abdominal pain, constipation and heartburn.  Musculoskeletal:  Negative for back pain, joint pain and myalgias.  Skin: Negative.   Neurological:  Negative for dizziness.  Psychiatric/Behavioral:  The patient is not nervous/anxious.    Physical Exam Constitutional:      General: She is not in acute distress.    Appearance: She is well-developed. She is not diaphoretic.  Neck:     Thyroid : No thyromegaly.  Cardiovascular:     Rate and Rhythm: Normal rate and regular rhythm.     Pulses: Normal pulses.     Heart sounds: Normal heart sounds.   Pulmonary:     Effort: Pulmonary effort is normal. No respiratory distress.     Breath sounds: Normal breath sounds.  Abdominal:     General: Bowel sounds are normal. There is no distension.     Palpations: Abdomen is soft.     Tenderness: There is no abdominal tenderness.  Musculoskeletal:        General: Normal range of motion.     Cervical back: Neck supple.     Right lower leg: No edema.     Left lower leg: No edema.  Lymphadenopathy:     Cervical: No cervical adenopathy.  Skin:    General: Skin is warm and dry.  Neurological:     Mental Status: She is alert and oriented to person, place, and time.  Psychiatric:        Mood and Affect: Mood normal.     ASSESSMENT/ PLAN:  TODAY  Syncope: lasted 8 minutes: related to atrial fibrillation and right lower lobe pneumonia.   2. Atrial fibrillation with RVR: heart rate is stable; will continue cardizem  ER 120 and lopressor  25 mg twice daily for rate control is taking eliquis  5 mg twice daily   3. Right lower lobe pneumonia: will complete cefdinir 300 mg twice daily and zithromax 500 mg daily; will complete mucinex twice daily   4. Chronic obstructive pulmonary disease unspecified COPD type: will continue albuterol  2 puffs four times daily   5. Acquired hypothyroidism: will continue synthroid  50 mcg daily   6. GERD without esophagitis: will continue protonix  40 mg daily   7. Coronary artery disease of native artery of native heart with angina pectoris. Will continue lopressor  25 mg twice daily has prn ntg  8. Aortic atherosclerosis (ct 04-04-20) not on statin due to advanced age  58. Chronic depression: will continue remeron  15 mg nightly and trazodone  300 mg nightly   10. Chronic anemia: hgb 11.8     Barnie Seip NP [facility]  call [phone]

## 2023-05-03 ENCOUNTER — Encounter: Payer: Self-pay | Admitting: Internal Medicine

## 2023-05-03 ENCOUNTER — Non-Acute Institutional Stay (SKILLED_NURSING_FACILITY): Payer: Self-pay | Admitting: Internal Medicine

## 2023-05-03 DIAGNOSIS — I4891 Unspecified atrial fibrillation: Secondary | ICD-10-CM

## 2023-05-03 DIAGNOSIS — J189 Pneumonia, unspecified organism: Secondary | ICD-10-CM

## 2023-05-03 DIAGNOSIS — R55 Syncope and collapse: Secondary | ICD-10-CM

## 2023-05-03 NOTE — Assessment & Plan Note (Signed)
 As imaging revealed significant carotid arterial artery disease; it is possible this could be related to vertebrobasilar insufficiency.  Monitor for any postural hypotension.

## 2023-05-03 NOTE — Progress Notes (Signed)
 NURSING HOME LOCATION:  [facility] ROOM NUMBER:  143 P  CODE STATUS: Full Code   PCP: Eligio Fairly MD  This is a comprehensive admission note to this SNFperformed on this date less than 30 days from date of admission. Included are preadmission medical/surgical history; reconciled medication list; family history; social history and comprehensive review of systems.  Corrections and additions to the records were documented. Comprehensive physical exam was also performed. Additionally a clinical summary was entered for each active diagnosis pertinent to this admission in the Problem List to enhance continuity of care.  HPI: She was initially hospitalized at [facility] 1/24 - 04/23/2023 ,brought to the ED via EMS after syncopal episode apparently getting out of the shower.  She was noted to have atrial fibrillation with RVR requiring Cardizem  drip.  Rate control was achieved and she was transitioned to oral medications.  2D echo revealed LVEF of 40 to 45% with global hypokinesis.  Significant carotid and vertebral artery disease were documented.  Formal chest x-ray revealed left lower lobe atelectasis with possible effusion.  Cardiomegaly was also documented. PT recommended home health services. On 1/30 she experienced another syncopal episode lasting 8 minutes.  She was admitted to [facility] and found to have right lower lobe pneumonia.  1 of 2 cultures did show gram-negative rods.  Repeat cultures were negative.  Clostridium perfringens was identified on 2/3 ; this was felt to be a contaminant as she had no GI symptoms.  She received azithromycin and Rocephin with subsequent transition to Zithromax and cefdinir through 05/06/2023. She was discharged to this SNF for rehab with ultimate goal to return home with her son and daughter-in-law.  Past medical and surgical history: Includes hypertension, CAD with history of myocardial infarction, hypothyroidism,COPD , carotid  artery disease, GERD, and dyslipidemia. Significant surgeries include CABG.  Family history: reviewed, non contributory due to advanced age.  Social history: Nondrinker; former smoker with 16-pack-year history of consumption.   Review of systems could not be completed due to profound lethargy.  She was essentially nonverbal.  She did whisper that she was fine.  She also denied any pain.  She could not follow commands and repeatedly drifted off to sleep.  Physical exam:  Pertinent or positive findings: She appears her age & chronically ill.  While asleep she exhibited hypopnea without snoring or apnea.  When she did awaken for brief periods of time, facies were blank.  Eyebrows are decreased laterally.  Facies are markedly weathered.  She is also profoundly hard of hearing, essentially deaf.  The right nasolabial fold is decreased.  The maxilla appears edentulous.  Her speech pattern was one of the high pitch character.  There was slight hyperpigmentation of the left posterior mandibular area.  Heart sounds are markedly distant.  Breath sounds are decreased.  Abdomen is protuberant.  She has trace edema at the sock line.  Pedal pulses were not palpable.  Limb atrophy and interosseous wasting is noted.  General appearance: no acute distress, increased work of breathing is present.   Lymphatic: No lymphadenopathy about the head, neck, axilla. Eyes: No conjunctival inflammation or lid edema is present. There is no scleral icterus. Ears:  External ear exam shows no significant lesions or deformities.   Nose:  External nasal examination shows no deformity or inflammation. Nasal mucosa are pink and moist without lesions, exudates Neck:  No thyromegaly, masses, tenderness noted.    Heart:  No gallop, murmur, click, rub.  Lungs:  without wheezes, rhonchi, rales, rubs. Abdomen: Bowel sounds are normal.  Abdomen is soft and nontender with no organomegaly, hernias, masses. GU: Deferred  Extremities:  No  cyanosis, clubbing. Neurologic exam: Balance, Rhomberg, finger to nose testing could not be completed due to clinical state Skin: Warm & dry w/o tenting. No significant rash.  See clinical summary under each active problem in the Problem List with associated updated therapeutic plan

## 2023-05-03 NOTE — Patient Instructions (Signed)
 See assessment and plan under each diagnosis in the problem list and acutely for this visit

## 2023-05-03 NOTE — Assessment & Plan Note (Signed)
 Presently she is afebrile.  No abnormal breath sounds noted on exam.  Continue to monitor and complete full course of antibiotics.

## 2023-05-03 NOTE — Assessment & Plan Note (Signed)
 On exam heart sounds are markedly distant but rhythm is clinically regular and rate controlled.

## 2023-05-04 ENCOUNTER — Other Ambulatory Visit (HOSPITAL_COMMUNITY)
Admission: RE | Admit: 2023-05-04 | Discharge: 2023-05-04 | Disposition: A | Discharge status: Home / Self Care | Payer: Medicare Other | Source: Skilled Nursing Facility | Attending: Adult Health | Admitting: Adult Health

## 2023-05-04 DIAGNOSIS — R001 Bradycardia, unspecified: Secondary | ICD-10-CM | POA: Insufficient documentation

## 2023-05-04 LAB — CBC WITH DIFFERENTIAL/PLATELET
Abs Immature Granulocytes: 0.11 10*3/uL — ABNORMAL HIGH (ref 0.00–0.07)
Basophils Absolute: 0 10*3/uL (ref 0.0–0.1)
Basophils Relative: 0 %
Eosinophils Absolute: 0 10*3/uL (ref 0.0–0.5)
Eosinophils Relative: 0 %
HCT: 36.1 % (ref 36.0–46.0)
Hemoglobin: 11.9 g/dL — ABNORMAL LOW (ref 12.0–15.0)
Immature Granulocytes: 1 %
Lymphocytes Relative: 5 %
Lymphs Abs: 1 10*3/uL (ref 0.7–4.0)
MCH: 29.7 pg (ref 26.0–34.0)
MCHC: 33 g/dL (ref 30.0–36.0)
MCV: 90 fL (ref 80.0–100.0)
Monocytes Absolute: 1.1 10*3/uL — ABNORMAL HIGH (ref 0.1–1.0)
Monocytes Relative: 5 %
Neutro Abs: 17.1 10*3/uL — ABNORMAL HIGH (ref 1.7–7.7)
Neutrophils Relative %: 89 %
Platelets: 276 10*3/uL (ref 150–400)
RBC: 4.01 MIL/uL (ref 3.87–5.11)
RDW: 15.5 % (ref 11.5–15.5)
WBC: 19.3 10*3/uL — ABNORMAL HIGH (ref 4.0–10.5)
nRBC: 0 % (ref 0.0–0.2)

## 2023-05-05 ENCOUNTER — Other Ambulatory Visit (HOSPITAL_COMMUNITY)
Admission: RE | Admit: 2023-05-05 | Discharge: 2023-05-05 | Disposition: A | Discharge status: Home / Self Care | Payer: Medicare Other | Source: Skilled Nursing Facility | Attending: Adult Health | Admitting: Adult Health

## 2023-05-05 DIAGNOSIS — R001 Bradycardia, unspecified: Secondary | ICD-10-CM | POA: Insufficient documentation

## 2023-05-05 LAB — BASIC METABOLIC PANEL
Anion gap: 7 (ref 5–15)
BUN: 24 mg/dL — ABNORMAL HIGH (ref 8–23)
CO2: 26 mmol/L (ref 22–32)
Calcium: 8.7 mg/dL — ABNORMAL LOW (ref 8.9–10.3)
Chloride: 103 mmol/L (ref 98–111)
Creatinine, Ser: 0.65 mg/dL (ref 0.44–1.00)
GFR, Estimated: >60 mL/min (ref >60)
Glucose, Bld: 87 mg/dL (ref 70–99)
Potassium: 4 mmol/L (ref 3.5–5.1)
Sodium: 136 mmol/L (ref 135–145)

## 2023-05-19 ENCOUNTER — Encounter: Payer: Self-pay | Admitting: Adult Health

## 2023-05-19 ENCOUNTER — Non-Acute Institutional Stay (SKILLED_NURSING_FACILITY): Payer: Self-pay | Admitting: Adult Health

## 2023-05-19 DIAGNOSIS — I700 Atherosclerosis of aorta: Secondary | ICD-10-CM

## 2023-05-19 DIAGNOSIS — J4489 Other specified chronic obstructive pulmonary disease: Secondary | ICD-10-CM

## 2023-05-19 DIAGNOSIS — I4891 Unspecified atrial fibrillation: Secondary | ICD-10-CM

## 2023-05-19 NOTE — Progress Notes (Signed)
 Location:  [facility] Room Number: 143p Place of Service:  SNF (31)   CODE STATUS: DNR  Allergies  Allergen Reactions   Levaquin [Levofloxacin In D5w] Hives   Penicillins Hives   Statins     Chief Complaint  Patient presents with   Acute Visit    Care plan meeting     HPI:  We have come together for her care plan meeting. Family present.  BIMS no; mood 0/30: is hard of hearing. Uses wheelchair without falls. She requires dependent assist with her adl care. She is incontinent of bladder and bowel. Dietary: supervision for meals; regular diet appetite fair; weight is 131.6 pounds. Therapy: bed mobility min assist; sit to stand min to mod assist; ambulate 150 feet with rolling walker with mod assist with guiding the walker; steps with mod assist; upper body mod assist; lower assist is max assist; brp mod to max assist. She continues to be followed for her chronic illnesses including:   Aortic atherosclerosis   Atrial fibrillation with RVR  Other specified chronic obstructive pulmonary disease  Past Medical History:  Diagnosis Date   Carotid artery disease (HCC)    COPD (chronic obstructive pulmonary disease) (HCC)    Coronary artery disease    Multivessel, LVEF 65%   GERD (gastroesophageal reflux disease)    Hyperlipidemia    Myocardial infarction (HCC)    NSTEMI 6/09    Past Surgical History:  Procedure Laterality Date   CATARACT EXTRACTION W/PHACO  03/05/2012   Procedure: CATARACT EXTRACTION PHACO AND INTRAOCULAR LENS PLACEMENT (IOC);  Surgeon: Gemma Payor, MD;  Location: [facility];  Service: Ophthalmology;  Laterality: Left;  CDE 25.71   CATARACT EXTRACTION W/PHACO  03/19/2012   Procedure: CATARACT EXTRACTION PHACO AND INTRAOCULAR LENS PLACEMENT (IOC);  Surgeon: Gemma Payor, MD;  Location: [facility];  Service: Ophthalmology;  Laterality: Right;  CDE:24.94   CORONARY ARTERY BYPASS GRAFT  03/29/2007   Off pump LIMA to LAD, SVG to first obtuse marginal , SVG to  PDA      Social History   Socioeconomic History   Marital status: Widowed    Spouse name: Not on file   Number of children: Not on file   Years of education: Not on file   Highest education level: Not on file  Occupational History   Occupation: Retired  Tobacco Use   Smoking status: Former    Current packs/day: 0.00    Average packs/day: 0.4 packs/day for 40.0 years (16.0 ttl pk-yrs)    Types: Cigarettes    Start date: 03/28/1957    Quit date: 03/28/1997    Years since quitting: 26.1   Smokeless tobacco: Never   Tobacco comments:    smoked 4 cigarettes per day  Substance and Sexual Activity   Alcohol use: No    Alcohol/week: 0.0 standard drinks of alcohol    Comment: occassionally   Drug use: No   Sexual activity: Yes    Birth control/protection: None  Other Topics Concern   Not on file  Social History Narrative   Not on file   Social Drivers of Health   Financial Resource Strain: Patient Unable To Answer (04/27/2023)   Received from [facility]   Overall Financial Resource Strain (CARDIA)    Difficulty of Paying Living Expenses: Patient unable to answer  Food Insecurity: Patient Unable To Answer (04/27/2023)   Received from [facility]   Hunger Vital Sign    Worried About Running Out of Food  in the Last Year: Patient unable to answer    Ran Out of Food in the Last Year: Patient unable to answer  Transportation Needs: No Transportation Needs (04/27/2023)   Received from [facility] - Transportation    Lack of Transportation (Medical): No    Lack of Transportation (Non-Medical): No  Physical Activity: Patient Unable To Answer (04/27/2023)   Received from [facility]   Exercise Vital Sign    Days of Exercise per Week: Patient unable to answer    Minutes of Exercise per Session: Patient unable to answer  Stress: Patient Unable To Answer (04/27/2023)   Received from [facility] of Occupational Health - Occupational Stress  Questionnaire    Feeling of Stress : Patient unable to answer  Social Connections: Patient Unable To Answer (04/27/2023)   Received from [facility]   Social Connection and Isolation Panel [NHANES]    Frequency of Communication with Friends and Family: Patient unable to answer    Frequency of Social Gatherings with Friends and Family: Patient unable to answer    Attends Religious Services: Patient unable to answer    Active Member of Clubs or Organizations: Patient unable to answer    Attends Banker Meetings: Patient unable to answer    Marital Status: Patient unable to answer  Intimate Partner Violence: Patient Unable To Answer (04/27/2023)   Received from [facility]   Humiliation, Afraid, Rape, and Kick questionnaire    Fear of Current or Ex-Partner: Patient unable to answer    Emotionally Abused: Patient unable to answer    Physically Abused: Patient unable to answer    Sexually Abused: Patient unable to answer   History reviewed. No pertinent family history.    VITAL SIGNS BP 123/62   Pulse 77   Temp (!) 97 F (36.1 C)   Resp 20   Ht 5\' 6"  (1.676 m)   Wt 131 lb 9.6 oz (59.7 kg)   SpO2 94%   BMI 21.24 kg/m   Outpatient Encounter Medications as of 05/19/2023  Medication Sig   albuterol (VENTOLIN HFA) 108 (90 Base) MCG/ACT inhaler Inhale 2 puffs into the lungs in the morning, at noon, in the evening, and at bedtime.   apixaban (ELIQUIS) 5 MG TABS tablet Take 5 mg by mouth 2 (two) times daily.   diltiazem (CARDIZEM CD) 120 MG 24 hr capsule Take 120 mg by mouth daily.   levothyroxine (SYNTHROID) 50 MCG tablet Take 50 mcg by mouth daily before breakfast.   metoprolol tartrate (LOPRESSOR) 25 MG tablet Take 25 mg by mouth 2 (two) times daily.   mirtazapine (REMERON) 15 MG tablet Take 15 mg by mouth at bedtime.   nitroGLYCERIN (NITROSTAT) 0.4 MG SL tablet Place 0.4 mg under the tongue every 5 (five) minutes as needed. Chest pain.   pantoprazole (PROTONIX) 40  MG tablet Take 40 mg by mouth daily.   trazodone (DESYREL) 300 MG tablet Take 300 mg by mouth at bedtime.   Facility-Administered Encounter Medications as of 05/19/2023  Medication   neomycin-polymyxin-dexameth (MAXITROL) 0.1 % ophth ointment     SIGNIFICANT DIAGNOSTIC EXAMS  TODAY  04-21-23: wbc 15.4; hgb 13.1; hct 40.6; mcv 93.8 plt 210; glucose 138; bun 19; creat 0.73; k+ 4.3; na++ 142; ca 8.4; gfr >60 04-29-23: wbcc 5.5; hgb 11.8; hct 35.8; mcv 88.8 plt 261; glucose 98; bun 13; creat 0.72; k+ 3.9; na++ 144; ca 7.9; gfr 77.  Review of Systems  Reason unable to perform ROS: unable to fully participate.   Physical Exam Constitutional:      General: She is not in acute distress.    Appearance: She is well-developed. She is not diaphoretic.  Neck:     Thyroid: No thyromegaly.  Cardiovascular:     Rate and Rhythm: Normal rate and regular rhythm.     Pulses: Normal pulses.     Heart sounds: Normal heart sounds.  Pulmonary:     Effort: Pulmonary effort is normal. No respiratory distress.     Breath sounds: Normal breath sounds.  Abdominal:     General: Bowel sounds are normal. There is no distension.     Palpations: Abdomen is soft.     Tenderness: There is no abdominal tenderness.  Musculoskeletal:        General: Normal range of motion.     Cervical back: Neck supple.     Right lower leg: No edema.     Left lower leg: No edema.  Lymphadenopathy:     Cervical: No cervical adenopathy.  Skin:    General: Skin is warm and dry.  Neurological:     Mental Status: She is alert. Mental status is at baseline.  Psychiatric:        Mood and Affect: Mood normal.      ASSESSMENT/ PLAN:  TODAY  Aortic atherosclerosis Atrial fibrillation with RVR Other specified chronic obstructive pulmonary disease  Will continue current medications Will continue current plan of care Will continue therapy as directed Goal of care to return back home; may need 02.   Time spent with  patient: 40 minutes: medications; therapy; dietary    Synthia Innocent NP [facility] Adult [facility]  call [phone]

## 2023-05-22 ENCOUNTER — Other Ambulatory Visit: Payer: Self-pay | Admitting: *Deleted

## 2023-05-22 NOTE — Patient Outreach (Signed)
 Mrs. Hannah Estes resides in [facility]. Screening for potential complex care management services as benefit of health plan and primary care provider.  Update received from [place], SNF social worker. Mrs. Hannah Estes will return home next week with her guardians per [place].   Will plan outreach accordingly to discuss complex care management follow up.   Raiford Noble, MSN, RN, BSN [place]  [facility], [facility] Communities RN Post- Acute Care Manager Direct Dial: [phone]

## 2023-05-23 DIAGNOSIS — I482 Chronic atrial fibrillation, unspecified: Secondary | ICD-10-CM | POA: Diagnosis not present

## 2023-05-25 ENCOUNTER — Encounter: Payer: Self-pay | Admitting: Adult Health

## 2023-05-25 ENCOUNTER — Other Ambulatory Visit: Payer: Self-pay | Admitting: Adult Health

## 2023-05-25 ENCOUNTER — Non-Acute Institutional Stay (SKILLED_NURSING_FACILITY): Payer: Self-pay | Admitting: Adult Health

## 2023-05-25 DIAGNOSIS — J189 Pneumonia, unspecified organism: Secondary | ICD-10-CM

## 2023-05-25 DIAGNOSIS — I700 Atherosclerosis of aorta: Secondary | ICD-10-CM

## 2023-05-25 DIAGNOSIS — I4891 Unspecified atrial fibrillation: Secondary | ICD-10-CM

## 2023-05-25 DIAGNOSIS — I25118 Atherosclerotic heart disease of native coronary artery with other forms of angina pectoris: Secondary | ICD-10-CM

## 2023-05-25 MED ORDER — METOPROLOL TARTRATE 25 MG PO TABS: 25.0000 mg | ORAL_TABLET | Freq: Two times a day (BID) | ORAL | 0 refills | Status: AC

## 2023-05-25 MED ORDER — PANTOPRAZOLE SODIUM 40 MG PO TBEC: 40.0000 mg | DELAYED_RELEASE_TABLET | Freq: Every day | ORAL | 0 refills | Status: AC

## 2023-05-25 MED ORDER — TRAZODONE HCL 300 MG PO TABS: 300.0000 mg | ORAL_TABLET | Freq: Every day | ORAL | 0 refills | Status: AC

## 2023-05-25 MED ORDER — MIRTAZAPINE 15 MG PO TABS: 15.0000 mg | ORAL_TABLET | Freq: Every day | ORAL | 0 refills | Status: AC

## 2023-05-25 MED ORDER — NITROGLYCERIN 0.4 MG SL SUBL: 0.4000 mg | SUBLINGUAL_TABLET | SUBLINGUAL | 0 refills | Status: AC | PRN

## 2023-05-25 MED ORDER — DILTIAZEM HCL ER COATED BEADS 120 MG PO CP24: 120.0000 mg | ORAL_CAPSULE | Freq: Every day | ORAL | 0 refills | Status: AC

## 2023-05-25 MED ORDER — APIXABAN 5 MG PO TABS: 5.0000 mg | ORAL_TABLET | Freq: Two times a day (BID) | ORAL | 0 refills | Status: AC

## 2023-05-25 MED ORDER — ALBUTEROL SULFATE HFA 108 (90 BASE) MCG/ACT IN AERS: 2.0000 | INHALATION_SPRAY | Freq: Four times a day (QID) | RESPIRATORY_TRACT | 0 refills | Status: AC

## 2023-05-25 MED ORDER — LEVOTHYROXINE SODIUM 50 MCG PO TABS: 50.0000 ug | ORAL_TABLET | Freq: Every day | ORAL | 0 refills | Status: AC

## 2023-05-25 NOTE — Progress Notes (Signed)
 Location:  [facility] Room Number: 143P Place of Service:  SNF (31) Hannah Estes S,NP  CODE STATUS: DNR  Allergies  Allergen Reactions   Levaquin [Levofloxacin In D5w] Hives   Penicillins Hives   Statins     Chief Complaint  Patient presents with   Discharge Note    HPI:  She is being discharged to home with home health for pt/ot. She will need home 02. She will need her prescriptions written and will need to follow up with her medical provider. She had been hospitalized syncope; pneumonia and bacteremia. She was admitted to this facility for short term rehab. Therapy: ambulate 100 feet with rolling walker and min assist; upper body mod assist lower body max assist brp mod assist.   Past Medical History:  Diagnosis Date   Carotid artery disease (HCC)    COPD (chronic obstructive pulmonary disease) (HCC)    Coronary artery disease    Multivessel, LVEF 65%   GERD (gastroesophageal reflux disease)    Hyperlipidemia    Myocardial infarction (HCC)    NSTEMI 6/09    Past Surgical History:  Procedure Laterality Date   CATARACT EXTRACTION W/PHACO  03/05/2012   Procedure: CATARACT EXTRACTION PHACO AND INTRAOCULAR LENS PLACEMENT (IOC);  Surgeon: Gemma Payor, MD;  Location: [facility];  Service: Ophthalmology;  Laterality: Left;  CDE 25.71   CATARACT EXTRACTION W/PHACO  03/19/2012   Procedure: CATARACT EXTRACTION PHACO AND INTRAOCULAR LENS PLACEMENT (IOC);  Surgeon: Gemma Payor, MD;  Location: [facility];  Service: Ophthalmology;  Laterality: Right;  CDE:24.94   CORONARY ARTERY BYPASS GRAFT  03/29/2007   Off pump LIMA to LAD, SVG to first obtuse marginal , SVG to PDA      Social History   Socioeconomic History   Marital status: Widowed    Spouse name: Not on file   Number of children: Not on file   Years of education: Not on file   Highest education level: Not on file  Occupational History   Occupation: Retired  Tobacco Use   Smoking status: Former     Current packs/day: 0.00    Average packs/day: 0.4 packs/day for 40.0 years (16.0 ttl pk-yrs)    Types: Cigarettes    Start date: 03/28/1957    Quit date: 03/28/1997    Years since quitting: 26.1   Smokeless tobacco: Never   Tobacco comments:    smoked 4 cigarettes per day  Substance and Sexual Activity   Alcohol use: No    Alcohol/week: 0.0 standard drinks of alcohol    Comment: occassionally   Drug use: No   Sexual activity: Yes    Birth control/protection: None  Other Topics Concern   Not on file  Social History Narrative   Not on file   Social Drivers of Health   Financial Resource Strain: Patient Unable To Answer (04/27/2023)   Received from [facility]   Overall Financial Resource Strain (CARDIA)    Difficulty of Paying Living Expenses: Patient unable to answer  Food Insecurity: Patient Unable To Answer (04/27/2023)   Received from [facility]   Hunger Vital Sign    Worried About Running Out of Food in the Last Year: Patient unable to answer    Ran Out of Food in the Last Year: Patient unable to answer  Transportation Needs: No Transportation Needs (04/27/2023)   Received from [facility] - Transportation    Lack of Transportation (Medical): No    Lack of  Transportation (Non-Medical): No  Physical Activity: Patient Unable To Answer (04/27/2023)   Received from [facility]   Exercise Vital Sign    Days of Exercise per Week: Patient unable to answer    Minutes of Exercise per Session: Patient unable to answer  Stress: Patient Unable To Answer (04/27/2023)   Received from [facility] of Occupational Health - Occupational Stress Questionnaire    Feeling of Stress : Patient unable to answer  Social Connections: Patient Unable To Answer (04/27/2023)   Received from [facility]   Social Connection and Isolation Panel [NHANES]    Frequency of Communication with Friends and Family: Patient unable to answer    Frequency of  Social Gatherings with Friends and Family: Patient unable to answer    Attends Religious Services: Patient unable to answer    Active Member of Clubs or Organizations: Patient unable to answer    Attends Banker Meetings: Patient unable to answer    Marital Status: Patient unable to answer  Intimate Partner Violence: Patient Unable To Answer (04/27/2023)   Received from [facility]   Humiliation, Afraid, Rape, and Kick questionnaire    Fear of Current or Ex-Partner: Patient unable to answer    Emotionally Abused: Patient unable to answer    Physically Abused: Patient unable to answer    Sexually Abused: Patient unable to answer   History reviewed. No pertinent family history.    VITAL SIGNS BP 119/71   Pulse 77   Temp 97.7 F (36.5 C)   Resp 18   Ht 5\' 3"  (1.6 m)   Wt 125 lb 14.4 oz (57.1 kg)   SpO2 95%   BMI 22.30 kg/m   Outpatient Encounter Medications as of 05/25/2023  Medication Sig   albuterol (VENTOLIN HFA) 108 (90 Base) MCG/ACT inhaler Inhale 2 puffs into the lungs in the morning, at noon, in the evening, and at bedtime.   apixaban (ELIQUIS) 5 MG TABS tablet Take 1 tablet (5 mg total) by mouth 2 (two) times daily.   diltiazem (CARDIZEM CD) 120 MG 24 hr capsule Take 1 capsule (120 mg total) by mouth daily.   levothyroxine (SYNTHROID) 50 MCG tablet Take 1 tablet (50 mcg total) by mouth daily before breakfast.   metoprolol tartrate (LOPRESSOR) 25 MG tablet Take 1 tablet (25 mg total) by mouth 2 (two) times daily.   mirtazapine (REMERON) 15 MG tablet Take 1 tablet (15 mg total) by mouth at bedtime.   nitroGLYCERIN (NITROSTAT) 0.4 MG SL tablet Place 1 tablet (0.4 mg total) under the tongue every 5 (five) minutes as needed. Chest pain.   pantoprazole (PROTONIX) 40 MG tablet Take 1 tablet (40 mg total) by mouth daily.   trazodone (DESYREL) 300 MG tablet Take 1 tablet (300 mg total) by mouth at bedtime.   Facility-Administered Encounter Medications as of  05/25/2023  Medication   neomycin-polymyxin-dexameth (MAXITROL) 0.1 % ophth ointment     SIGNIFICANT DIAGNOSTIC EXAMS  TODAY  04-21-23: wbc 15.4; hgb 13.1; hct 40.6; mcv 93.8 plt 210; glucose 138; bun 19; creat 0.73; k+ 4.3; na++ 142; ca 8.4; gfr >60 04-29-23: wbcc 5.5; hgb 11.8; hct 35.8; mcv 88.8 plt 261; glucose 98; bun 13; creat 0.72; k+ 3.9; na++ 144; ca 7.9; gfr 77.   Review of Systems  Reason unable to perform ROS: unable to fully participate.   Physical Exam Constitutional:      General: She is not in acute distress.  Appearance: She is well-developed. She is not diaphoretic.  Neck:     Thyroid: No thyromegaly.  Cardiovascular:     Rate and Rhythm: Normal rate and regular rhythm.     Pulses: Normal pulses.     Heart sounds: Normal heart sounds.  Pulmonary:     Effort: Pulmonary effort is normal. No respiratory distress.     Breath sounds: Normal breath sounds.  Abdominal:     General: Bowel sounds are normal. There is no distension.     Palpations: Abdomen is soft.     Tenderness: There is no abdominal tenderness.  Musculoskeletal:        General: Normal range of motion.     Cervical back: Neck supple.     Right lower leg: No edema.     Left lower leg: No edema.  Lymphadenopathy:     Cervical: No cervical adenopathy.  Skin:    General: Skin is warm and dry.  Neurological:     Mental Status: She is alert. Mental status is at baseline.  Psychiatric:        Mood and Affect: Mood normal.      ASSESSMENT/ PLAN:   Patient is being discharged with the following home health services: pt/ot to evaluate and treat as indicated for gait balance strength adl training    Patient is being discharged with the following durable medical equipment:  none needed   Patient has been advised to f/u with their PCP in 1-2 weeks to for a transitions of care visit.  Social services at their facility was responsible for arranging this appointment.  Pt was provided with adequate  prescriptions of noncontrolled medications to reach the scheduled appointment .  For controlled substances, a limited supply was provided as appropriate for the individual patient.  If the pt normally receives these medications from a pain clinic or has a contract with another physician, these medications should be received from that clinic or physician only).   A 30 day supply of prescription medications have been sent to [facility]   Time spent with patient: 35 minutes: medications; home health dme.   Synthia Innocent NP [facility] Adult [facility]  call [phone]

## 2023-05-26 ENCOUNTER — Encounter (HOSPITAL_COMMUNITY)
Admission: RE | Admit: 2023-05-26 | Discharge: 2023-05-26 | Disposition: A | Discharge status: Home / Self Care | Payer: Medicare Other | Source: Skilled Nursing Facility | Attending: Internal Medicine | Admitting: Internal Medicine

## 2023-05-26 ENCOUNTER — Encounter: Payer: Self-pay | Admitting: Internal Medicine

## 2023-05-26 ENCOUNTER — Non-Acute Institutional Stay (SKILLED_NURSING_FACILITY): Payer: Self-pay | Admitting: Internal Medicine

## 2023-05-26 DIAGNOSIS — I495 Sick sinus syndrome: Secondary | ICD-10-CM

## 2023-05-26 DIAGNOSIS — I480 Paroxysmal atrial fibrillation: Secondary | ICD-10-CM | POA: Insufficient documentation

## 2023-05-26 DIAGNOSIS — F32A Depression, unspecified: Secondary | ICD-10-CM | POA: Diagnosis not present

## 2023-05-26 DIAGNOSIS — E039 Hypothyroidism, unspecified: Secondary | ICD-10-CM | POA: Diagnosis not present

## 2023-05-26 LAB — BASIC METABOLIC PANEL
Anion gap: 4 — ABNORMAL LOW (ref 5–15)
BUN: 17 mg/dL (ref 8–23)
CO2: 25 mmol/L (ref 22–32)
Calcium: 8.8 mg/dL — ABNORMAL LOW (ref 8.9–10.3)
Chloride: 106 mmol/L (ref 98–111)
Creatinine, Ser: 0.8 mg/dL (ref 0.44–1.00)
GFR, Estimated: >60 mL/min (ref >60)
Glucose, Bld: 165 mg/dL — ABNORMAL HIGH (ref 70–99)
Potassium: 4.8 mmol/L (ref 3.5–5.1)
Sodium: 135 mmol/L (ref 135–145)

## 2023-05-26 LAB — CBC
HCT: 37.5 % (ref 36.0–46.0)
Hemoglobin: 11.7 g/dL — ABNORMAL LOW (ref 12.0–15.0)
MCH: 28.6 pg (ref 26.0–34.0)
MCHC: 31.2 g/dL (ref 30.0–36.0)
MCV: 91.7 fL (ref 80.0–100.0)
Platelets: 263 10*3/uL (ref 150–400)
RBC: 4.09 MIL/uL (ref 3.87–5.11)
RDW: 14.3 % (ref 11.5–15.5)
WBC: 6.7 10*3/uL (ref 4.0–10.5)
nRBC: 0 % (ref 0.0–0.2)

## 2023-05-26 LAB — TSH: TSH: 5.743 u[IU]/mL — ABNORMAL HIGH (ref 0.350–4.500)

## 2023-05-26 NOTE — Assessment & Plan Note (Signed)
 TSH will be updated as intermittent RVR; R/O subclinical hyperthyroidism.

## 2023-05-26 NOTE — Assessment & Plan Note (Addendum)
 Clinically she is not depressed.  She has been lethargic and sleeping for extended periods of time.  Trazodone will be decreased to 150 mg at bedtime because of possible cardiac risks as noted in Up To Date in the context of her A-fib with variable rate from 50-100 and prolonged QT interval of 492 ms.

## 2023-05-26 NOTE — Patient Instructions (Signed)
 See assessment and plan under each diagnosis in the problem list and acutely for this visit

## 2023-05-26 NOTE — Progress Notes (Signed)
 NURSING HOME LOCATION:  [facility] ROOM NUMBER:  143 P  CODE STATUS: DNR   PCP: Kirstie Peri, MD   This is a nursing facility follow up visit for specific acute issue of bradyarrhythmia & associated hypoxia..  Interim medical record and care since last SNF visit was updated with review of diagnostic studies and change in clinical status since last visit were documented.  HPI: Staff was evaluating her for possible discharge 3/4 when bradycardia with heart rates in the 40s associated with hypoxia and change in mental status as profound lethargy was noted. She was admitted to this facility on 1/26 after being hospitalized 1/24 - 1/26 following syncopal episode apparently getting out of the shower.  On 04/21/2023 EKG revealed atrial fibrillation with rapid ventricular response and a prolonged QT interval of 502 ms.Cardizem drip was initiated.  Rate control was achieved and she was transitioned to oral medications.  2D echo revealed LVEF 40-45% with global hypokinesis. On 1/30 she experienced another syncopal episode lasting 8 minutes.  She was admitted to [facility] and found to have right lower lobe pneumonia.  1 of 2 cultures did document gram-negative rods.  Repeat cultures were negative.  Organism was identified as Clostridium perfringens, felt to be a contaminant as she had no associated GI symptoms.  She initially received azithromycin and Rocephin with subsequent transition to Zithromax and cefdinir through 2/8. The bradycardia is in the context of rate limiting antihypertensives of metoprolol 25 mg twice daily and diltiazem 120 mg daily.  She is also on levothyroxine 50 mcg daily.  Most recent TSH in Epic was 5.040 on 09/02/2007. Most recent labs were performed 05/05/2023 and revealed minimal prerenal azotemia with a BUN of 24 and improved hypocalcemia with a value of 8.7.  Her normochromic, normocytic anemia was also improved with H/H increasing from 11.3/36.1 up to  11.9/36.1. Significant past medical history includes coronary artery disease with history of non-STEMI.  Review of systems: Dementia and deafness precluded completion of review of systems.  When asked how she was doing her response was "fine."    Physical exam:  Pertinent or positive findings: As noted she is profoundly deaf.  Her voice is weak.  Facies are weathered. She is wearing nasal oxygen.  The maxilla is edentulous.  Slight chin hirsutism is present. Heart rhythm is irregular.  The rate is variable.  Breath sounds are decreased.  Pedal pulses are not palpable.  General appearance:  no acute distress, increased work of breathing is present.   Lymphatic: No lymphadenopathy about the head, neck, axilla. Eyes: No conjunctival inflammation or lid edema is present. There is no scleral icterus. Ears:  External ear exam shows no significant lesions or deformities.   Nose:  External nasal examination shows no deformity or inflammation. Nasal mucosa are pink and moist without lesions, exudates Oral exam:  Lips and gums are healthy appearing. There is no oropharyngeal erythema or exudate. Neck:  No thyromegaly, masses, tenderness noted.    Heart:  No gallop, murmur, click, rub .  Lungs: without wheezes, rhonchi, rales, rubs. Abdomen: Bowel sounds are normal. Abdomen is soft and nontender with no organomegaly, hernias, masses. GU: Deferred  Extremities:  No cyanosis, clubbing, edema  Neurologic exam :Balance, Rhomberg, finger to nose testing could not be completed due to clinical state Deep tendon reflexes are equal Skin: Warm & dry w/o tenting. No significant lesions or rash.  See summary under each active problem in the Problem List with associated updated  therapeutic plan

## 2023-05-26 NOTE — Assessment & Plan Note (Signed)
 Metoprolol will be held.  Low-dose diltiazem will be continued.  TSH will be updated.  Trazodone will be decreased to 150 mg at bedtime.

## 2023-06-02 DIAGNOSIS — I4891 Unspecified atrial fibrillation: Secondary | ICD-10-CM | POA: Diagnosis not present

## 2023-06-02 DIAGNOSIS — J9611 Chronic respiratory failure with hypoxia: Secondary | ICD-10-CM | POA: Diagnosis not present

## 2023-06-02 DIAGNOSIS — J189 Pneumonia, unspecified organism: Secondary | ICD-10-CM | POA: Diagnosis not present

## 2023-06-02 DIAGNOSIS — Z299 Encounter for prophylactic measures, unspecified: Secondary | ICD-10-CM | POA: Diagnosis not present

## 2023-06-02 DIAGNOSIS — G301 Alzheimer's disease with late onset: Secondary | ICD-10-CM | POA: Diagnosis not present

## 2023-06-02 DIAGNOSIS — I10 Essential (primary) hypertension: Secondary | ICD-10-CM | POA: Diagnosis not present

## 2023-06-05 ENCOUNTER — Other Ambulatory Visit: Payer: Self-pay | Admitting: *Deleted

## 2023-06-05 NOTE — Patient Outreach (Addendum)
 Hannah Estes discharged from [facility] on 05/31/23. Screening for potential complex care management needs as benefit of health plan and PCP.   [place] social worker reports [facility] home health was arranged. Confirms primary contact is Mr. Elwyn Reach who is legal guardian.   Telephone call made to Lisa Roca (legal guardian/DPR) [phone] to discuss complex care management follow up. Patient identifiers confirmed. Mr. Elwyn Reach endorses Mrs. Steeves is now total care since she has gotten home from SNF. States Mrs. [place] has had virtual PCP follow appointment. States PCP is filling out paperwork as well for Medicaid application requirements. Mr. Elwyn Reach states they have been in contact with [facility] regarding long term care placement. However, Mr. Elwyn Reach reports he thinks Mrs. Pracht needs hospice at this point. Mr. Elwyn Reach states he has been Mrs. Dreibelbis's court appointed guardian for the last 6 years.  Writer discussed referral for [employer]. Writer provided Mr. Elwyn Reach with [facility] telephone number and discussed writer will also make referral to [facility].  Mr. Elwyn Reach has writer's telephone number to call if he needs further assistance.   Writer securely sent hospice referral to [facility].   Writer will plan follow up call tomorrow to Mr. Elwyn Reach to find out status of hospice referral. Will plan to make referral to [facility] complex care management team if further needs are assessed.   Addendum: Secure communication received from [facility]-- "Hospice referral has been received and in process."    Raiford Noble, MSN, RN, BSN   [facility], [facility] Commun[facility] RN Post- Acute Care Insurance underwriter Dial: [phone]

## 2023-06-07 ENCOUNTER — Other Ambulatory Visit: Payer: Self-pay | Admitting: *Deleted

## 2023-06-07 DIAGNOSIS — K219 Gastro-esophageal reflux disease without esophagitis: Secondary | ICD-10-CM | POA: Diagnosis not present

## 2023-06-07 DIAGNOSIS — I10 Essential (primary) hypertension: Secondary | ICD-10-CM | POA: Diagnosis not present

## 2023-06-07 DIAGNOSIS — G311 Senile degeneration of brain, not elsewhere classified: Secondary | ICD-10-CM | POA: Diagnosis not present

## 2023-06-07 DIAGNOSIS — F0390 Unspecified dementia without behavioral disturbance: Secondary | ICD-10-CM | POA: Diagnosis not present

## 2023-06-07 DIAGNOSIS — I25119 Atherosclerotic heart disease of native coronary artery with unspecified angina pectoris: Secondary | ICD-10-CM | POA: Diagnosis not present

## 2023-06-07 DIAGNOSIS — I4891 Unspecified atrial fibrillation: Secondary | ICD-10-CM | POA: Diagnosis not present

## 2023-06-07 DIAGNOSIS — D649 Anemia, unspecified: Secondary | ICD-10-CM | POA: Diagnosis not present

## 2023-06-07 DIAGNOSIS — J4489 Other specified chronic obstructive pulmonary disease: Secondary | ICD-10-CM | POA: Diagnosis not present

## 2023-06-07 DIAGNOSIS — E785 Hyperlipidemia, unspecified: Secondary | ICD-10-CM | POA: Diagnosis not present

## 2023-06-07 DIAGNOSIS — I5020 Unspecified systolic (congestive) heart failure: Secondary | ICD-10-CM | POA: Diagnosis not present

## 2023-06-07 DIAGNOSIS — I495 Sick sinus syndrome: Secondary | ICD-10-CM | POA: Diagnosis not present

## 2023-06-07 DIAGNOSIS — R634 Abnormal weight loss: Secondary | ICD-10-CM | POA: Diagnosis not present

## 2023-06-07 NOTE — Patient Outreach (Signed)
 Post- Acute Care Manager follow up. Telephone call made to Mr. Hannah Estes (DPR/legal guardian) [phone].  Mr. Hannah Estes confirms [facility]'s admission scheduled for today.   No identifiable VBCI complex care management needs if [name] admits to hospice services.   Will follow up to confirm at later time to confirm admission to [facility] hospice.  Raiford Noble, MSN, RN, BSN [place]  [facility], Healthy Communities RN Post- Acute Care Manager Direct Dial: [phone]

## 2023-06-08 ENCOUNTER — Other Ambulatory Visit: Payer: Self-pay | Admitting: *Deleted

## 2023-06-08 DIAGNOSIS — I10 Essential (primary) hypertension: Secondary | ICD-10-CM | POA: Diagnosis not present

## 2023-06-08 DIAGNOSIS — I495 Sick sinus syndrome: Secondary | ICD-10-CM | POA: Diagnosis not present

## 2023-06-08 DIAGNOSIS — F0390 Unspecified dementia without behavioral disturbance: Secondary | ICD-10-CM | POA: Diagnosis not present

## 2023-06-08 DIAGNOSIS — G311 Senile degeneration of brain, not elsewhere classified: Secondary | ICD-10-CM | POA: Diagnosis not present

## 2023-06-08 DIAGNOSIS — J4489 Other specified chronic obstructive pulmonary disease: Secondary | ICD-10-CM | POA: Diagnosis not present

## 2023-06-08 DIAGNOSIS — I25119 Atherosclerotic heart disease of native coronary artery with unspecified angina pectoris: Secondary | ICD-10-CM | POA: Diagnosis not present

## 2023-06-08 NOTE — Patient Outreach (Signed)
 Post- Acute Care Manager follow up.   Confirmed with Lanae [facility] Community Liaison. Mrs. Stachnik admitted to [facility] hospice services on 06/07/23.  No identifiable VBCI complex care management needs.   Raiford Noble, MSN, RN, BSN   [facility], [facility] RN Post- Acute Care Manager Direct Dial: [phone]

## 2023-06-09 DIAGNOSIS — F0390 Unspecified dementia without behavioral disturbance: Secondary | ICD-10-CM | POA: Diagnosis not present

## 2023-06-09 DIAGNOSIS — I10 Essential (primary) hypertension: Secondary | ICD-10-CM | POA: Diagnosis not present

## 2023-06-09 DIAGNOSIS — I25119 Atherosclerotic heart disease of native coronary artery with unspecified angina pectoris: Secondary | ICD-10-CM | POA: Diagnosis not present

## 2023-06-09 DIAGNOSIS — G311 Senile degeneration of brain, not elsewhere classified: Secondary | ICD-10-CM | POA: Diagnosis not present

## 2023-06-09 DIAGNOSIS — I495 Sick sinus syndrome: Secondary | ICD-10-CM | POA: Diagnosis not present

## 2023-06-09 DIAGNOSIS — J4489 Other specified chronic obstructive pulmonary disease: Secondary | ICD-10-CM | POA: Diagnosis not present

## 2023-06-11 DIAGNOSIS — I25119 Atherosclerotic heart disease of native coronary artery with unspecified angina pectoris: Secondary | ICD-10-CM | POA: Diagnosis not present

## 2023-06-11 DIAGNOSIS — J4489 Other specified chronic obstructive pulmonary disease: Secondary | ICD-10-CM | POA: Diagnosis not present

## 2023-06-11 DIAGNOSIS — I10 Essential (primary) hypertension: Secondary | ICD-10-CM | POA: Diagnosis not present

## 2023-06-11 DIAGNOSIS — F0390 Unspecified dementia without behavioral disturbance: Secondary | ICD-10-CM | POA: Diagnosis not present

## 2023-06-11 DIAGNOSIS — G311 Senile degeneration of brain, not elsewhere classified: Secondary | ICD-10-CM | POA: Diagnosis not present

## 2023-06-11 DIAGNOSIS — I495 Sick sinus syndrome: Secondary | ICD-10-CM | POA: Diagnosis not present

## 2023-06-12 DIAGNOSIS — I10 Essential (primary) hypertension: Secondary | ICD-10-CM | POA: Diagnosis not present

## 2023-06-12 DIAGNOSIS — J4489 Other specified chronic obstructive pulmonary disease: Secondary | ICD-10-CM | POA: Diagnosis not present

## 2023-06-12 DIAGNOSIS — I495 Sick sinus syndrome: Secondary | ICD-10-CM | POA: Diagnosis not present

## 2023-06-12 DIAGNOSIS — G311 Senile degeneration of brain, not elsewhere classified: Secondary | ICD-10-CM | POA: Diagnosis not present

## 2023-06-12 DIAGNOSIS — I25119 Atherosclerotic heart disease of native coronary artery with unspecified angina pectoris: Secondary | ICD-10-CM | POA: Diagnosis not present

## 2023-06-12 DIAGNOSIS — F0390 Unspecified dementia without behavioral disturbance: Secondary | ICD-10-CM | POA: Diagnosis not present

## 2023-06-13 DIAGNOSIS — I25119 Atherosclerotic heart disease of native coronary artery with unspecified angina pectoris: Secondary | ICD-10-CM | POA: Diagnosis not present

## 2023-06-13 DIAGNOSIS — I10 Essential (primary) hypertension: Secondary | ICD-10-CM | POA: Diagnosis not present

## 2023-06-13 DIAGNOSIS — I495 Sick sinus syndrome: Secondary | ICD-10-CM | POA: Diagnosis not present

## 2023-06-13 DIAGNOSIS — F0390 Unspecified dementia without behavioral disturbance: Secondary | ICD-10-CM | POA: Diagnosis not present

## 2023-06-13 DIAGNOSIS — G311 Senile degeneration of brain, not elsewhere classified: Secondary | ICD-10-CM | POA: Diagnosis not present

## 2023-06-13 DIAGNOSIS — J4489 Other specified chronic obstructive pulmonary disease: Secondary | ICD-10-CM | POA: Diagnosis not present

## 2023-06-14 DIAGNOSIS — G311 Senile degeneration of brain, not elsewhere classified: Secondary | ICD-10-CM | POA: Diagnosis not present

## 2023-06-14 DIAGNOSIS — I25119 Atherosclerotic heart disease of native coronary artery with unspecified angina pectoris: Secondary | ICD-10-CM | POA: Diagnosis not present

## 2023-06-14 DIAGNOSIS — F0390 Unspecified dementia without behavioral disturbance: Secondary | ICD-10-CM | POA: Diagnosis not present

## 2023-06-14 DIAGNOSIS — J4489 Other specified chronic obstructive pulmonary disease: Secondary | ICD-10-CM | POA: Diagnosis not present

## 2023-06-14 DIAGNOSIS — I10 Essential (primary) hypertension: Secondary | ICD-10-CM | POA: Diagnosis not present

## 2023-06-14 DIAGNOSIS — I495 Sick sinus syndrome: Secondary | ICD-10-CM | POA: Diagnosis not present

## 2023-06-15 DIAGNOSIS — I25119 Atherosclerotic heart disease of native coronary artery with unspecified angina pectoris: Secondary | ICD-10-CM | POA: Diagnosis not present

## 2023-06-15 DIAGNOSIS — I495 Sick sinus syndrome: Secondary | ICD-10-CM | POA: Diagnosis not present

## 2023-06-15 DIAGNOSIS — J4489 Other specified chronic obstructive pulmonary disease: Secondary | ICD-10-CM | POA: Diagnosis not present

## 2023-06-15 DIAGNOSIS — F0390 Unspecified dementia without behavioral disturbance: Secondary | ICD-10-CM | POA: Diagnosis not present

## 2023-06-15 DIAGNOSIS — G311 Senile degeneration of brain, not elsewhere classified: Secondary | ICD-10-CM | POA: Diagnosis not present

## 2023-06-15 DIAGNOSIS — I10 Essential (primary) hypertension: Secondary | ICD-10-CM | POA: Diagnosis not present

## 2023-06-16 DIAGNOSIS — F0390 Unspecified dementia without behavioral disturbance: Secondary | ICD-10-CM | POA: Diagnosis not present

## 2023-06-16 DIAGNOSIS — J4489 Other specified chronic obstructive pulmonary disease: Secondary | ICD-10-CM | POA: Diagnosis not present

## 2023-06-16 DIAGNOSIS — G311 Senile degeneration of brain, not elsewhere classified: Secondary | ICD-10-CM | POA: Diagnosis not present

## 2023-06-16 DIAGNOSIS — I495 Sick sinus syndrome: Secondary | ICD-10-CM | POA: Diagnosis not present

## 2023-06-16 DIAGNOSIS — I25119 Atherosclerotic heart disease of native coronary artery with unspecified angina pectoris: Secondary | ICD-10-CM | POA: Diagnosis not present

## 2023-06-16 DIAGNOSIS — I10 Essential (primary) hypertension: Secondary | ICD-10-CM | POA: Diagnosis not present

## 2023-06-19 DIAGNOSIS — I25119 Atherosclerotic heart disease of native coronary artery with unspecified angina pectoris: Secondary | ICD-10-CM | POA: Diagnosis not present

## 2023-06-19 DIAGNOSIS — I10 Essential (primary) hypertension: Secondary | ICD-10-CM | POA: Diagnosis not present

## 2023-06-19 DIAGNOSIS — G311 Senile degeneration of brain, not elsewhere classified: Secondary | ICD-10-CM | POA: Diagnosis not present

## 2023-06-19 DIAGNOSIS — F0390 Unspecified dementia without behavioral disturbance: Secondary | ICD-10-CM | POA: Diagnosis not present

## 2023-06-19 DIAGNOSIS — J4489 Other specified chronic obstructive pulmonary disease: Secondary | ICD-10-CM | POA: Diagnosis not present

## 2023-06-19 DIAGNOSIS — I495 Sick sinus syndrome: Secondary | ICD-10-CM | POA: Diagnosis not present

## 2023-06-21 DIAGNOSIS — I25119 Atherosclerotic heart disease of native coronary artery with unspecified angina pectoris: Secondary | ICD-10-CM | POA: Diagnosis not present

## 2023-06-21 DIAGNOSIS — I10 Essential (primary) hypertension: Secondary | ICD-10-CM | POA: Diagnosis not present

## 2023-06-21 DIAGNOSIS — I495 Sick sinus syndrome: Secondary | ICD-10-CM | POA: Diagnosis not present

## 2023-06-21 DIAGNOSIS — J4489 Other specified chronic obstructive pulmonary disease: Secondary | ICD-10-CM | POA: Diagnosis not present

## 2023-06-21 DIAGNOSIS — F0390 Unspecified dementia without behavioral disturbance: Secondary | ICD-10-CM | POA: Diagnosis not present

## 2023-06-21 DIAGNOSIS — G311 Senile degeneration of brain, not elsewhere classified: Secondary | ICD-10-CM | POA: Diagnosis not present

## 2023-06-26 DIAGNOSIS — I10 Essential (primary) hypertension: Secondary | ICD-10-CM | POA: Diagnosis not present

## 2023-06-26 DIAGNOSIS — G311 Senile degeneration of brain, not elsewhere classified: Secondary | ICD-10-CM | POA: Diagnosis not present

## 2023-06-26 DIAGNOSIS — J4489 Other specified chronic obstructive pulmonary disease: Secondary | ICD-10-CM | POA: Diagnosis not present

## 2023-06-26 DIAGNOSIS — I25119 Atherosclerotic heart disease of native coronary artery with unspecified angina pectoris: Secondary | ICD-10-CM | POA: Diagnosis not present

## 2023-06-26 DIAGNOSIS — I495 Sick sinus syndrome: Secondary | ICD-10-CM | POA: Diagnosis not present

## 2023-06-26 DIAGNOSIS — F0390 Unspecified dementia without behavioral disturbance: Secondary | ICD-10-CM | POA: Diagnosis not present

## 2023-06-27 DIAGNOSIS — E785 Hyperlipidemia, unspecified: Secondary | ICD-10-CM | POA: Diagnosis not present

## 2023-06-27 DIAGNOSIS — G311 Senile degeneration of brain, not elsewhere classified: Secondary | ICD-10-CM | POA: Diagnosis not present

## 2023-06-27 DIAGNOSIS — I10 Essential (primary) hypertension: Secondary | ICD-10-CM | POA: Diagnosis not present

## 2023-06-27 DIAGNOSIS — I4891 Unspecified atrial fibrillation: Secondary | ICD-10-CM | POA: Diagnosis not present

## 2023-06-27 DIAGNOSIS — F0390 Unspecified dementia without behavioral disturbance: Secondary | ICD-10-CM | POA: Diagnosis not present

## 2023-06-27 DIAGNOSIS — D649 Anemia, unspecified: Secondary | ICD-10-CM | POA: Diagnosis not present

## 2023-06-27 DIAGNOSIS — I5020 Unspecified systolic (congestive) heart failure: Secondary | ICD-10-CM | POA: Diagnosis not present

## 2023-06-27 DIAGNOSIS — K219 Gastro-esophageal reflux disease without esophagitis: Secondary | ICD-10-CM | POA: Diagnosis not present

## 2023-06-27 DIAGNOSIS — I25119 Atherosclerotic heart disease of native coronary artery with unspecified angina pectoris: Secondary | ICD-10-CM | POA: Diagnosis not present

## 2023-06-27 DIAGNOSIS — I495 Sick sinus syndrome: Secondary | ICD-10-CM | POA: Diagnosis not present

## 2023-06-27 DIAGNOSIS — J4489 Other specified chronic obstructive pulmonary disease: Secondary | ICD-10-CM | POA: Diagnosis not present

## 2023-06-27 DIAGNOSIS — R634 Abnormal weight loss: Secondary | ICD-10-CM | POA: Diagnosis not present

## 2023-06-28 DIAGNOSIS — F0390 Unspecified dementia without behavioral disturbance: Secondary | ICD-10-CM | POA: Diagnosis not present

## 2023-06-28 DIAGNOSIS — I495 Sick sinus syndrome: Secondary | ICD-10-CM | POA: Diagnosis not present

## 2023-06-28 DIAGNOSIS — I25119 Atherosclerotic heart disease of native coronary artery with unspecified angina pectoris: Secondary | ICD-10-CM | POA: Diagnosis not present

## 2023-06-28 DIAGNOSIS — J4489 Other specified chronic obstructive pulmonary disease: Secondary | ICD-10-CM | POA: Diagnosis not present

## 2023-06-28 DIAGNOSIS — G311 Senile degeneration of brain, not elsewhere classified: Secondary | ICD-10-CM | POA: Diagnosis not present

## 2023-06-28 DIAGNOSIS — I10 Essential (primary) hypertension: Secondary | ICD-10-CM | POA: Diagnosis not present

## 2023-06-30 DIAGNOSIS — G311 Senile degeneration of brain, not elsewhere classified: Secondary | ICD-10-CM | POA: Diagnosis not present

## 2023-06-30 DIAGNOSIS — I10 Essential (primary) hypertension: Secondary | ICD-10-CM | POA: Diagnosis not present

## 2023-06-30 DIAGNOSIS — I25119 Atherosclerotic heart disease of native coronary artery with unspecified angina pectoris: Secondary | ICD-10-CM | POA: Diagnosis not present

## 2023-06-30 DIAGNOSIS — I495 Sick sinus syndrome: Secondary | ICD-10-CM | POA: Diagnosis not present

## 2023-06-30 DIAGNOSIS — F0390 Unspecified dementia without behavioral disturbance: Secondary | ICD-10-CM | POA: Diagnosis not present

## 2023-06-30 DIAGNOSIS — J4489 Other specified chronic obstructive pulmonary disease: Secondary | ICD-10-CM | POA: Diagnosis not present

## 2023-07-03 DIAGNOSIS — G311 Senile degeneration of brain, not elsewhere classified: Secondary | ICD-10-CM | POA: Diagnosis not present

## 2023-07-03 DIAGNOSIS — I10 Essential (primary) hypertension: Secondary | ICD-10-CM | POA: Diagnosis not present

## 2023-07-03 DIAGNOSIS — I25119 Atherosclerotic heart disease of native coronary artery with unspecified angina pectoris: Secondary | ICD-10-CM | POA: Diagnosis not present

## 2023-07-03 DIAGNOSIS — F0390 Unspecified dementia without behavioral disturbance: Secondary | ICD-10-CM | POA: Diagnosis not present

## 2023-07-03 DIAGNOSIS — J4489 Other specified chronic obstructive pulmonary disease: Secondary | ICD-10-CM | POA: Diagnosis not present

## 2023-07-03 DIAGNOSIS — I495 Sick sinus syndrome: Secondary | ICD-10-CM | POA: Diagnosis not present

## 2023-07-04 DIAGNOSIS — I25119 Atherosclerotic heart disease of native coronary artery with unspecified angina pectoris: Secondary | ICD-10-CM | POA: Diagnosis not present

## 2023-07-04 DIAGNOSIS — G311 Senile degeneration of brain, not elsewhere classified: Secondary | ICD-10-CM | POA: Diagnosis not present

## 2023-07-04 DIAGNOSIS — J4489 Other specified chronic obstructive pulmonary disease: Secondary | ICD-10-CM | POA: Diagnosis not present

## 2023-07-04 DIAGNOSIS — F0390 Unspecified dementia without behavioral disturbance: Secondary | ICD-10-CM | POA: Diagnosis not present

## 2023-07-04 DIAGNOSIS — I10 Essential (primary) hypertension: Secondary | ICD-10-CM | POA: Diagnosis not present

## 2023-07-04 DIAGNOSIS — I495 Sick sinus syndrome: Secondary | ICD-10-CM | POA: Diagnosis not present

## 2023-07-06 DIAGNOSIS — I25119 Atherosclerotic heart disease of native coronary artery with unspecified angina pectoris: Secondary | ICD-10-CM | POA: Diagnosis not present

## 2023-07-06 DIAGNOSIS — G311 Senile degeneration of brain, not elsewhere classified: Secondary | ICD-10-CM | POA: Diagnosis not present

## 2023-07-06 DIAGNOSIS — I495 Sick sinus syndrome: Secondary | ICD-10-CM | POA: Diagnosis not present

## 2023-07-06 DIAGNOSIS — F0390 Unspecified dementia without behavioral disturbance: Secondary | ICD-10-CM | POA: Diagnosis not present

## 2023-07-06 DIAGNOSIS — J4489 Other specified chronic obstructive pulmonary disease: Secondary | ICD-10-CM | POA: Diagnosis not present

## 2023-07-06 DIAGNOSIS — I10 Essential (primary) hypertension: Secondary | ICD-10-CM | POA: Diagnosis not present

## 2023-07-07 DIAGNOSIS — F0390 Unspecified dementia without behavioral disturbance: Secondary | ICD-10-CM | POA: Diagnosis not present

## 2023-07-07 DIAGNOSIS — I495 Sick sinus syndrome: Secondary | ICD-10-CM | POA: Diagnosis not present

## 2023-07-07 DIAGNOSIS — G311 Senile degeneration of brain, not elsewhere classified: Secondary | ICD-10-CM | POA: Diagnosis not present

## 2023-07-07 DIAGNOSIS — I25119 Atherosclerotic heart disease of native coronary artery with unspecified angina pectoris: Secondary | ICD-10-CM | POA: Diagnosis not present

## 2023-07-07 DIAGNOSIS — I10 Essential (primary) hypertension: Secondary | ICD-10-CM | POA: Diagnosis not present

## 2023-07-07 DIAGNOSIS — J4489 Other specified chronic obstructive pulmonary disease: Secondary | ICD-10-CM | POA: Diagnosis not present

## 2023-07-10 DIAGNOSIS — I25119 Atherosclerotic heart disease of native coronary artery with unspecified angina pectoris: Secondary | ICD-10-CM | POA: Diagnosis not present

## 2023-07-10 DIAGNOSIS — G311 Senile degeneration of brain, not elsewhere classified: Secondary | ICD-10-CM | POA: Diagnosis not present

## 2023-07-10 DIAGNOSIS — F0390 Unspecified dementia without behavioral disturbance: Secondary | ICD-10-CM | POA: Diagnosis not present

## 2023-07-10 DIAGNOSIS — I10 Essential (primary) hypertension: Secondary | ICD-10-CM | POA: Diagnosis not present

## 2023-07-10 DIAGNOSIS — J4489 Other specified chronic obstructive pulmonary disease: Secondary | ICD-10-CM | POA: Diagnosis not present

## 2023-07-10 DIAGNOSIS — I495 Sick sinus syndrome: Secondary | ICD-10-CM | POA: Diagnosis not present

## 2023-07-12 DIAGNOSIS — F0390 Unspecified dementia without behavioral disturbance: Secondary | ICD-10-CM | POA: Diagnosis not present

## 2023-07-12 DIAGNOSIS — I10 Essential (primary) hypertension: Secondary | ICD-10-CM | POA: Diagnosis not present

## 2023-07-12 DIAGNOSIS — J4489 Other specified chronic obstructive pulmonary disease: Secondary | ICD-10-CM | POA: Diagnosis not present

## 2023-07-12 DIAGNOSIS — I25119 Atherosclerotic heart disease of native coronary artery with unspecified angina pectoris: Secondary | ICD-10-CM | POA: Diagnosis not present

## 2023-07-12 DIAGNOSIS — G311 Senile degeneration of brain, not elsewhere classified: Secondary | ICD-10-CM | POA: Diagnosis not present

## 2023-07-12 DIAGNOSIS — I495 Sick sinus syndrome: Secondary | ICD-10-CM | POA: Diagnosis not present

## 2023-07-13 DIAGNOSIS — I10 Essential (primary) hypertension: Secondary | ICD-10-CM | POA: Diagnosis not present

## 2023-07-13 DIAGNOSIS — J4489 Other specified chronic obstructive pulmonary disease: Secondary | ICD-10-CM | POA: Diagnosis not present

## 2023-07-13 DIAGNOSIS — F0390 Unspecified dementia without behavioral disturbance: Secondary | ICD-10-CM | POA: Diagnosis not present

## 2023-07-13 DIAGNOSIS — I495 Sick sinus syndrome: Secondary | ICD-10-CM | POA: Diagnosis not present

## 2023-07-13 DIAGNOSIS — G311 Senile degeneration of brain, not elsewhere classified: Secondary | ICD-10-CM | POA: Diagnosis not present

## 2023-07-13 DIAGNOSIS — I25119 Atherosclerotic heart disease of native coronary artery with unspecified angina pectoris: Secondary | ICD-10-CM | POA: Diagnosis not present

## 2023-07-14 DIAGNOSIS — F0390 Unspecified dementia without behavioral disturbance: Secondary | ICD-10-CM | POA: Diagnosis not present

## 2023-07-14 DIAGNOSIS — J4489 Other specified chronic obstructive pulmonary disease: Secondary | ICD-10-CM | POA: Diagnosis not present

## 2023-07-14 DIAGNOSIS — I495 Sick sinus syndrome: Secondary | ICD-10-CM | POA: Diagnosis not present

## 2023-07-14 DIAGNOSIS — G311 Senile degeneration of brain, not elsewhere classified: Secondary | ICD-10-CM | POA: Diagnosis not present

## 2023-07-14 DIAGNOSIS — I10 Essential (primary) hypertension: Secondary | ICD-10-CM | POA: Diagnosis not present

## 2023-07-14 DIAGNOSIS — I25119 Atherosclerotic heart disease of native coronary artery with unspecified angina pectoris: Secondary | ICD-10-CM | POA: Diagnosis not present

## 2023-07-16 DIAGNOSIS — F0390 Unspecified dementia without behavioral disturbance: Secondary | ICD-10-CM | POA: Diagnosis not present

## 2023-07-16 DIAGNOSIS — I495 Sick sinus syndrome: Secondary | ICD-10-CM | POA: Diagnosis not present

## 2023-07-16 DIAGNOSIS — I10 Essential (primary) hypertension: Secondary | ICD-10-CM | POA: Diagnosis not present

## 2023-07-16 DIAGNOSIS — I25119 Atherosclerotic heart disease of native coronary artery with unspecified angina pectoris: Secondary | ICD-10-CM | POA: Diagnosis not present

## 2023-07-16 DIAGNOSIS — J4489 Other specified chronic obstructive pulmonary disease: Secondary | ICD-10-CM | POA: Diagnosis not present

## 2023-07-16 DIAGNOSIS — G311 Senile degeneration of brain, not elsewhere classified: Secondary | ICD-10-CM | POA: Diagnosis not present

## 2023-07-17 DIAGNOSIS — F0390 Unspecified dementia without behavioral disturbance: Secondary | ICD-10-CM | POA: Diagnosis not present

## 2023-07-17 DIAGNOSIS — J4489 Other specified chronic obstructive pulmonary disease: Secondary | ICD-10-CM | POA: Diagnosis not present

## 2023-07-17 DIAGNOSIS — G311 Senile degeneration of brain, not elsewhere classified: Secondary | ICD-10-CM | POA: Diagnosis not present

## 2023-07-17 DIAGNOSIS — I495 Sick sinus syndrome: Secondary | ICD-10-CM | POA: Diagnosis not present

## 2023-07-17 DIAGNOSIS — I25119 Atherosclerotic heart disease of native coronary artery with unspecified angina pectoris: Secondary | ICD-10-CM | POA: Diagnosis not present

## 2023-07-17 DIAGNOSIS — I10 Essential (primary) hypertension: Secondary | ICD-10-CM | POA: Diagnosis not present

## 2023-07-18 DIAGNOSIS — I10 Essential (primary) hypertension: Secondary | ICD-10-CM | POA: Diagnosis not present

## 2023-07-18 DIAGNOSIS — F0390 Unspecified dementia without behavioral disturbance: Secondary | ICD-10-CM | POA: Diagnosis not present

## 2023-07-18 DIAGNOSIS — I495 Sick sinus syndrome: Secondary | ICD-10-CM | POA: Diagnosis not present

## 2023-07-18 DIAGNOSIS — G311 Senile degeneration of brain, not elsewhere classified: Secondary | ICD-10-CM | POA: Diagnosis not present

## 2023-07-18 DIAGNOSIS — J4489 Other specified chronic obstructive pulmonary disease: Secondary | ICD-10-CM | POA: Diagnosis not present

## 2023-07-18 DIAGNOSIS — I25119 Atherosclerotic heart disease of native coronary artery with unspecified angina pectoris: Secondary | ICD-10-CM | POA: Diagnosis not present

## 2023-07-19 DIAGNOSIS — I10 Essential (primary) hypertension: Secondary | ICD-10-CM | POA: Diagnosis not present

## 2023-07-19 DIAGNOSIS — I495 Sick sinus syndrome: Secondary | ICD-10-CM | POA: Diagnosis not present

## 2023-07-19 DIAGNOSIS — F0390 Unspecified dementia without behavioral disturbance: Secondary | ICD-10-CM | POA: Diagnosis not present

## 2023-07-19 DIAGNOSIS — I25119 Atherosclerotic heart disease of native coronary artery with unspecified angina pectoris: Secondary | ICD-10-CM | POA: Diagnosis not present

## 2023-07-19 DIAGNOSIS — G311 Senile degeneration of brain, not elsewhere classified: Secondary | ICD-10-CM | POA: Diagnosis not present

## 2023-07-19 DIAGNOSIS — J4489 Other specified chronic obstructive pulmonary disease: Secondary | ICD-10-CM | POA: Diagnosis not present

## 2023-07-20 DIAGNOSIS — F0390 Unspecified dementia without behavioral disturbance: Secondary | ICD-10-CM | POA: Diagnosis not present

## 2023-07-20 DIAGNOSIS — G311 Senile degeneration of brain, not elsewhere classified: Secondary | ICD-10-CM | POA: Diagnosis not present

## 2023-07-20 DIAGNOSIS — I25119 Atherosclerotic heart disease of native coronary artery with unspecified angina pectoris: Secondary | ICD-10-CM | POA: Diagnosis not present

## 2023-07-20 DIAGNOSIS — J4489 Other specified chronic obstructive pulmonary disease: Secondary | ICD-10-CM | POA: Diagnosis not present

## 2023-07-20 DIAGNOSIS — I495 Sick sinus syndrome: Secondary | ICD-10-CM | POA: Diagnosis not present

## 2023-07-20 DIAGNOSIS — I10 Essential (primary) hypertension: Secondary | ICD-10-CM | POA: Diagnosis not present

## 2023-07-21 DIAGNOSIS — I495 Sick sinus syndrome: Secondary | ICD-10-CM | POA: Diagnosis not present

## 2023-07-21 DIAGNOSIS — G311 Senile degeneration of brain, not elsewhere classified: Secondary | ICD-10-CM | POA: Diagnosis not present

## 2023-07-21 DIAGNOSIS — I10 Essential (primary) hypertension: Secondary | ICD-10-CM | POA: Diagnosis not present

## 2023-07-21 DIAGNOSIS — I25119 Atherosclerotic heart disease of native coronary artery with unspecified angina pectoris: Secondary | ICD-10-CM | POA: Diagnosis not present

## 2023-07-21 DIAGNOSIS — F0390 Unspecified dementia without behavioral disturbance: Secondary | ICD-10-CM | POA: Diagnosis not present

## 2023-07-21 DIAGNOSIS — J4489 Other specified chronic obstructive pulmonary disease: Secondary | ICD-10-CM | POA: Diagnosis not present

## 2023-07-22 DIAGNOSIS — I10 Essential (primary) hypertension: Secondary | ICD-10-CM | POA: Diagnosis not present

## 2023-07-22 DIAGNOSIS — J4489 Other specified chronic obstructive pulmonary disease: Secondary | ICD-10-CM | POA: Diagnosis not present

## 2023-07-22 DIAGNOSIS — I25119 Atherosclerotic heart disease of native coronary artery with unspecified angina pectoris: Secondary | ICD-10-CM | POA: Diagnosis not present

## 2023-07-22 DIAGNOSIS — I495 Sick sinus syndrome: Secondary | ICD-10-CM | POA: Diagnosis not present

## 2023-07-22 DIAGNOSIS — F0390 Unspecified dementia without behavioral disturbance: Secondary | ICD-10-CM | POA: Diagnosis not present

## 2023-07-22 DIAGNOSIS — G311 Senile degeneration of brain, not elsewhere classified: Secondary | ICD-10-CM | POA: Diagnosis not present

## 2023-07-24 DIAGNOSIS — G311 Senile degeneration of brain, not elsewhere classified: Secondary | ICD-10-CM | POA: Diagnosis not present

## 2023-07-24 DIAGNOSIS — J4489 Other specified chronic obstructive pulmonary disease: Secondary | ICD-10-CM | POA: Diagnosis not present

## 2023-07-24 DIAGNOSIS — I25119 Atherosclerotic heart disease of native coronary artery with unspecified angina pectoris: Secondary | ICD-10-CM | POA: Diagnosis not present

## 2023-07-24 DIAGNOSIS — I495 Sick sinus syndrome: Secondary | ICD-10-CM | POA: Diagnosis not present

## 2023-07-24 DIAGNOSIS — I10 Essential (primary) hypertension: Secondary | ICD-10-CM | POA: Diagnosis not present

## 2023-07-24 DIAGNOSIS — F0390 Unspecified dementia without behavioral disturbance: Secondary | ICD-10-CM | POA: Diagnosis not present

## 2023-07-25 DIAGNOSIS — J4489 Other specified chronic obstructive pulmonary disease: Secondary | ICD-10-CM | POA: Diagnosis not present

## 2023-07-25 DIAGNOSIS — I495 Sick sinus syndrome: Secondary | ICD-10-CM | POA: Diagnosis not present

## 2023-07-25 DIAGNOSIS — G311 Senile degeneration of brain, not elsewhere classified: Secondary | ICD-10-CM | POA: Diagnosis not present

## 2023-07-25 DIAGNOSIS — F0390 Unspecified dementia without behavioral disturbance: Secondary | ICD-10-CM | POA: Diagnosis not present

## 2023-07-25 DIAGNOSIS — I10 Essential (primary) hypertension: Secondary | ICD-10-CM | POA: Diagnosis not present

## 2023-07-25 DIAGNOSIS — I25119 Atherosclerotic heart disease of native coronary artery with unspecified angina pectoris: Secondary | ICD-10-CM | POA: Diagnosis not present

## 2023-07-26 DIAGNOSIS — J4489 Other specified chronic obstructive pulmonary disease: Secondary | ICD-10-CM | POA: Diagnosis not present

## 2023-07-26 DIAGNOSIS — I10 Essential (primary) hypertension: Secondary | ICD-10-CM | POA: Diagnosis not present

## 2023-07-26 DIAGNOSIS — G311 Senile degeneration of brain, not elsewhere classified: Secondary | ICD-10-CM | POA: Diagnosis not present

## 2023-07-26 DIAGNOSIS — F0390 Unspecified dementia without behavioral disturbance: Secondary | ICD-10-CM | POA: Diagnosis not present

## 2023-07-26 DIAGNOSIS — I25119 Atherosclerotic heart disease of native coronary artery with unspecified angina pectoris: Secondary | ICD-10-CM | POA: Diagnosis not present

## 2023-07-26 DIAGNOSIS — I495 Sick sinus syndrome: Secondary | ICD-10-CM | POA: Diagnosis not present

## 2023-07-27 DIAGNOSIS — G311 Senile degeneration of brain, not elsewhere classified: Secondary | ICD-10-CM | POA: Diagnosis not present

## 2023-07-27 DIAGNOSIS — F0390 Unspecified dementia without behavioral disturbance: Secondary | ICD-10-CM | POA: Diagnosis not present

## 2023-07-27 DIAGNOSIS — I10 Essential (primary) hypertension: Secondary | ICD-10-CM | POA: Diagnosis not present

## 2023-07-27 DIAGNOSIS — D649 Anemia, unspecified: Secondary | ICD-10-CM | POA: Diagnosis not present

## 2023-07-27 DIAGNOSIS — E785 Hyperlipidemia, unspecified: Secondary | ICD-10-CM | POA: Diagnosis not present

## 2023-07-27 DIAGNOSIS — I4891 Unspecified atrial fibrillation: Secondary | ICD-10-CM | POA: Diagnosis not present

## 2023-07-27 DIAGNOSIS — R634 Abnormal weight loss: Secondary | ICD-10-CM | POA: Diagnosis not present

## 2023-07-27 DIAGNOSIS — K219 Gastro-esophageal reflux disease without esophagitis: Secondary | ICD-10-CM | POA: Diagnosis not present

## 2023-07-27 DIAGNOSIS — J4489 Other specified chronic obstructive pulmonary disease: Secondary | ICD-10-CM | POA: Diagnosis not present

## 2023-07-27 DIAGNOSIS — I25119 Atherosclerotic heart disease of native coronary artery with unspecified angina pectoris: Secondary | ICD-10-CM | POA: Diagnosis not present

## 2023-07-27 DIAGNOSIS — I495 Sick sinus syndrome: Secondary | ICD-10-CM | POA: Diagnosis not present

## 2023-07-27 DIAGNOSIS — I5020 Unspecified systolic (congestive) heart failure: Secondary | ICD-10-CM | POA: Diagnosis not present

## 2023-07-28 DIAGNOSIS — G311 Senile degeneration of brain, not elsewhere classified: Secondary | ICD-10-CM | POA: Diagnosis not present

## 2023-07-28 DIAGNOSIS — I25119 Atherosclerotic heart disease of native coronary artery with unspecified angina pectoris: Secondary | ICD-10-CM | POA: Diagnosis not present

## 2023-07-28 DIAGNOSIS — J4489 Other specified chronic obstructive pulmonary disease: Secondary | ICD-10-CM | POA: Diagnosis not present

## 2023-07-28 DIAGNOSIS — I495 Sick sinus syndrome: Secondary | ICD-10-CM | POA: Diagnosis not present

## 2023-07-28 DIAGNOSIS — I10 Essential (primary) hypertension: Secondary | ICD-10-CM | POA: Diagnosis not present

## 2023-07-28 DIAGNOSIS — F0390 Unspecified dementia without behavioral disturbance: Secondary | ICD-10-CM | POA: Diagnosis not present

## 2023-07-29 DIAGNOSIS — I25119 Atherosclerotic heart disease of native coronary artery with unspecified angina pectoris: Secondary | ICD-10-CM | POA: Diagnosis not present

## 2023-07-29 DIAGNOSIS — J4489 Other specified chronic obstructive pulmonary disease: Secondary | ICD-10-CM | POA: Diagnosis not present

## 2023-07-29 DIAGNOSIS — I10 Essential (primary) hypertension: Secondary | ICD-10-CM | POA: Diagnosis not present

## 2023-07-29 DIAGNOSIS — F0390 Unspecified dementia without behavioral disturbance: Secondary | ICD-10-CM | POA: Diagnosis not present

## 2023-07-29 DIAGNOSIS — I495 Sick sinus syndrome: Secondary | ICD-10-CM | POA: Diagnosis not present

## 2023-07-29 DIAGNOSIS — G311 Senile degeneration of brain, not elsewhere classified: Secondary | ICD-10-CM | POA: Diagnosis not present

## 2023-07-30 DIAGNOSIS — I10 Essential (primary) hypertension: Secondary | ICD-10-CM | POA: Diagnosis not present

## 2023-07-30 DIAGNOSIS — F0390 Unspecified dementia without behavioral disturbance: Secondary | ICD-10-CM | POA: Diagnosis not present

## 2023-07-30 DIAGNOSIS — I495 Sick sinus syndrome: Secondary | ICD-10-CM | POA: Diagnosis not present

## 2023-07-30 DIAGNOSIS — G311 Senile degeneration of brain, not elsewhere classified: Secondary | ICD-10-CM | POA: Diagnosis not present

## 2023-07-30 DIAGNOSIS — J4489 Other specified chronic obstructive pulmonary disease: Secondary | ICD-10-CM | POA: Diagnosis not present

## 2023-07-30 DIAGNOSIS — I25119 Atherosclerotic heart disease of native coronary artery with unspecified angina pectoris: Secondary | ICD-10-CM | POA: Diagnosis not present

## 2023-07-31 DIAGNOSIS — I10 Essential (primary) hypertension: Secondary | ICD-10-CM | POA: Diagnosis not present

## 2023-07-31 DIAGNOSIS — J4489 Other specified chronic obstructive pulmonary disease: Secondary | ICD-10-CM | POA: Diagnosis not present

## 2023-07-31 DIAGNOSIS — I495 Sick sinus syndrome: Secondary | ICD-10-CM | POA: Diagnosis not present

## 2023-07-31 DIAGNOSIS — G311 Senile degeneration of brain, not elsewhere classified: Secondary | ICD-10-CM | POA: Diagnosis not present

## 2023-07-31 DIAGNOSIS — I25119 Atherosclerotic heart disease of native coronary artery with unspecified angina pectoris: Secondary | ICD-10-CM | POA: Diagnosis not present

## 2023-07-31 DIAGNOSIS — F0390 Unspecified dementia without behavioral disturbance: Secondary | ICD-10-CM | POA: Diagnosis not present

## 2023-08-02 DIAGNOSIS — I25119 Atherosclerotic heart disease of native coronary artery with unspecified angina pectoris: Secondary | ICD-10-CM | POA: Diagnosis not present

## 2023-08-02 DIAGNOSIS — I10 Essential (primary) hypertension: Secondary | ICD-10-CM | POA: Diagnosis not present

## 2023-08-02 DIAGNOSIS — I495 Sick sinus syndrome: Secondary | ICD-10-CM | POA: Diagnosis not present

## 2023-08-02 DIAGNOSIS — J4489 Other specified chronic obstructive pulmonary disease: Secondary | ICD-10-CM | POA: Diagnosis not present

## 2023-08-02 DIAGNOSIS — F0390 Unspecified dementia without behavioral disturbance: Secondary | ICD-10-CM | POA: Diagnosis not present

## 2023-08-02 DIAGNOSIS — G311 Senile degeneration of brain, not elsewhere classified: Secondary | ICD-10-CM | POA: Diagnosis not present
